# Patient Record
Sex: Female | Born: 1995 | Race: Black or African American | Hispanic: No | Marital: Single | Smoking: Never smoker
Health system: Southern US, Community
[De-identification: ages and names within clinical notes are randomized; demographics above are authoritative.]

## PROBLEM LIST (undated history)

## (undated) DIAGNOSIS — F410 Panic disorder [episodic paroxysmal anxiety] without agoraphobia: Secondary | ICD-10-CM

## (undated) DIAGNOSIS — F419 Anxiety disorder, unspecified: Secondary | ICD-10-CM

## (undated) DIAGNOSIS — K219 Gastro-esophageal reflux disease without esophagitis: Secondary | ICD-10-CM

## (undated) DIAGNOSIS — K5909 Other constipation: Secondary | ICD-10-CM

## (undated) HISTORY — DX: Morbid (severe) obesity due to excess calories: E66.01

## (undated) HISTORY — PX: NO PAST SURGERIES: SHX2092

## (undated) HISTORY — DX: Other constipation: K59.09

---
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
## 2016-07-18 ENCOUNTER — Ambulatory Visit (HOSPITAL_COMMUNITY)
Admission: EM | Admit: 2016-07-18 | Discharge: 2016-07-18 | Disposition: A | Discharge status: Home / Self Care | Payer: Medicaid Other | Attending: Internal Medicine | Admitting: Internal Medicine

## 2016-07-18 ENCOUNTER — Encounter (HOSPITAL_COMMUNITY): Payer: Self-pay | Admitting: Family Medicine

## 2016-07-18 DIAGNOSIS — R071 Chest pain on breathing: Secondary | ICD-10-CM

## 2016-07-18 DIAGNOSIS — K219 Gastro-esophageal reflux disease without esophagitis: Secondary | ICD-10-CM

## 2016-07-18 DIAGNOSIS — R079 Chest pain, unspecified: Secondary | ICD-10-CM | POA: Diagnosis not present

## 2016-07-18 DIAGNOSIS — R1013 Epigastric pain: Secondary | ICD-10-CM

## 2016-07-18 DIAGNOSIS — R0789 Other chest pain: Secondary | ICD-10-CM

## 2016-07-18 MED ORDER — OMEPRAZOLE 20 MG PO CPDR: 20.0000 mg | DELAYED_RELEASE_CAPSULE | Freq: Every day | ORAL | 0 refills | Status: DC

## 2016-07-18 NOTE — ED Provider Notes (Signed)
CSN: [id number]     Arrival date & time 07/18/16  1444 History   None    Chief Complaint  Patient presents with  . Chest Pain   (Consider location/radiation/quality/duration/timing/severity/associated sxs/prior Treatment) HPI  Michelle Simon is a 21 y.o. female presenting to UC with c/o intermittent chest pain and upper abdominal/epigastric pain for 3-4 days.  Hx of costochondritis and acid reflux.  She has tried Advil, which provided temporary relief.  She has also taken Tums with mild relief. Pain started after pt ate the other day.  Pt also notes she has a hx of anxiety. No prior hx of heart problems.  Denies SOB but she does get a split second of sharp electric shock pain in the center of her chest at times.  Denies diaphoresis, n/v/d.  No hx of blood clots. Denies leg pain or swelling. No hx of asthma.    Past Medical History:  Diagnosis Date  . Anxiety   . GERD (gastroesophageal reflux disease)    History reviewed. No pertinent surgical history. History reviewed. No pertinent family history. Social History  Substance Use Topics  . Smoking status: Never Smoker  . Smokeless tobacco: Never Used  . Alcohol use No   OB History    No data available     Review of Systems  Constitutional: Negative for chills, diaphoresis and fever.  HENT: Positive for congestion. Negative for ear pain, sore throat, trouble swallowing and voice change.   Respiratory: Positive for cough. Negative for shortness of breath.   Cardiovascular: Positive for chest pain. Negative for palpitations.  Gastrointestinal: Positive for abdominal pain (epigastric). Negative for diarrhea, nausea and vomiting.  Musculoskeletal: Negative for arthralgias, back pain and myalgias.  Skin: Negative for rash.    Allergies  Augmentin [amoxicillin-pot clavulanate]  Home Medications   Prior to Admission medications   Medication Sig Start Date End Date Taking? Authorizing Provider  IBUPROFEN PO Take by mouth.     Historical Provider, MD  LORazepam (ATIVAN) 1 MG tablet Take 1 mg by mouth every 8 (eight) hours as needed for anxiety.    Historical Provider, MD  norelgestromin-ethinyl estradiol (ORTHO EVRA) 150-35 MCG/24HR transdermal patch Place 1 patch onto the skin once a week.    Historical Provider, MD  omeprazole (PRILOSEC) 20 MG capsule Take 1 capsule (20 mg total) by mouth daily. 07/18/16   Junius Finner, PA-C   Meds Ordered and Administered this Visit  Medications - No data to display  BP 127/82   Pulse (!) 101   Temp 99.1 F (37.3 C)   Resp 18   SpO2 99%  No data found.   Physical Exam  Constitutional: She is oriented to person, place, and time. She appears well-developed and well-nourished. No distress.  HENT:  Head: Normocephalic and atraumatic.  Mouth/Throat: Oropharynx is clear and moist.  Eyes: EOM are normal.  Neck: Normal range of motion. Neck supple.  Cardiovascular: Normal rate and regular rhythm.   Pulmonary/Chest: Effort normal and breath sounds normal. No stridor. No respiratory distress. She has no wheezes. She has no rales.  Abdominal: Soft. She exhibits no distension and no mass. There is tenderness ( epigastrium). There is no rebound and no guarding.  Musculoskeletal: Normal range of motion.  Lymphadenopathy:    She has no cervical adenopathy.  Neurological: She is alert and oriented to person, place, and time.  Skin: Skin is warm and dry. She is not diaphoretic.  Psychiatric: She has a normal mood and affect. Her behavior is  normal.  Nursing note and vitals reviewed.   Urgent Care Course     Procedures (including critical care time)  Labs Review Labs Reviewed - No data to display  Imaging Review No results found.    MDM   1. Chest pain in adult   2. Costochondral chest pain   3. Epigastric pain   4. Gastroesophageal reflux disease, esophagitis presence not specified    Pt c/o epigastric pain and chest pain that is intermittent for 3-4 days.     Exam- mild epigastric tenderness, otherwise unremarkable. O2 Sat 99% on RA  Doubt PE or ACS  Will try trial of Omeprazole. It looks like pt has been on it in the past.  Encouraged f/u with PCP in 2 weeks if not improving. Discussed symptoms that warrant emergent care in the ED.    Junius Finner, PA-C 07/18/16 [phone]

## 2016-07-18 NOTE — ED Triage Notes (Addendum)
Pt here for chest pain. sts that she has a hx of reflux and costochondritis. sts she took advil Sunday and Monday and it helped. sts that sharp electric shock pain that is intermittent. Denies SOB or any other symptoms. sts the other day she felt like she was getting a cold and some coughing. sts hurts to breathe at times.

## 2016-11-25 ENCOUNTER — Emergency Department (HOSPITAL_COMMUNITY)
Admission: EM | Admit: 2016-11-25 | Discharge: 2016-11-25 | Disposition: A | Discharge status: Home / Self Care | Payer: Medicaid Other | Attending: Emergency Medicine | Admitting: Emergency Medicine

## 2016-11-25 ENCOUNTER — Emergency Department (HOSPITAL_COMMUNITY): Payer: Medicaid Other

## 2016-11-25 ENCOUNTER — Encounter (HOSPITAL_COMMUNITY): Payer: Self-pay | Admitting: Emergency Medicine

## 2016-11-25 DIAGNOSIS — B351 Tinea unguium: Secondary | ICD-10-CM | POA: Insufficient documentation

## 2016-11-25 DIAGNOSIS — Z79899 Other long term (current) drug therapy: Secondary | ICD-10-CM | POA: Diagnosis not present

## 2016-11-25 DIAGNOSIS — F419 Anxiety disorder, unspecified: Secondary | ICD-10-CM | POA: Diagnosis present

## 2016-11-25 DIAGNOSIS — F410 Panic disorder [episodic paroxysmal anxiety] without agoraphobia: Secondary | ICD-10-CM | POA: Insufficient documentation

## 2016-11-25 MED ORDER — TERBINAFINE HCL 250 MG PO TABS: 250.0000 mg | ORAL_TABLET | Freq: Every day | ORAL | 1 refills | Status: AC

## 2016-11-25 MED ORDER — HYDROXYZINE HCL 25 MG PO TABS: 25.0000 mg | ORAL_TABLET | Freq: Three times a day (TID) | ORAL | 0 refills | Status: DC | PRN

## 2016-11-25 NOTE — ED Triage Notes (Signed)
Woke up anxious and short of breath.  Took ativan 1 mg at home.  Denies having any SOB at this time.  Reports feeling better.

## 2016-11-25 NOTE — ED Provider Notes (Signed)
[facility] Provider Note   CSN: [id number] Arrival date & time: 11/25/16  0446     History   Chief Complaint Chief Complaint  Patient presents with  . Anxiety    HPI Michelle Simon is a 21 y.o. female.  HPI   21 yo F with PMHx GERD, anxiety here with anxiety. Pt states she has h/o panic attacks and has been under increased stress recently. 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 She was up late and admits to heavy caffeine intake. She awoke this AM feeling anxious then SOB. She felt like she was breathing fast and couldn't catch her breath. She then became anxious and worried. Took one ativan which resolved her sx within 30 minutes. She has had no subsequent sx. No leg swelling. No CP. No h/o blood clots. She feels better now.  Of note, she also incidentally notes small areas of discoloration on b/l big toenails after recent pedicure. No pain or redness. No fever. No drainage.  Past Medical History:  Diagnosis Date  . Anxiety   . GERD (gastroesophageal reflux disease)     There are no active problems to display for this patient.   History reviewed. No pertinent surgical history.  OB History    No data available       Home Medications    Prior to Admission medications   Medication Sig Start Date End Date Taking? Authorizing Provider  hydrOXYzine (ATARAX/VISTARIL) 25 MG tablet Take 1 tablet (25 mg total) by mouth every 8 (eight) hours as needed for anxiety. 11/25/16   Shaune Pollack, MD  IBUPROFEN PO Take by mouth.    [provider]  LORazepam (ATIVAN) 1 MG tablet Take 1 mg by mouth every 8 (eight) hours as needed for anxiety.    [provider]  norelgestromin-ethinyl estradiol (ORTHO EVRA) 150-35 MCG/24HR transdermal patch Place 1 patch onto the skin once a week.    [provider]  omeprazole (PRILOSEC) 20 MG capsule Take 1 capsule (20 mg total) by mouth daily. 07/18/16   Lurene Shadow, PA-C  terbinafine  (LAMISIL) 250 MG tablet Take 1 tablet (250 mg total) by mouth daily. Take for 6 weeks then re-examine nail; if you have persistent discoloration, take for an additional 6 weeks 11/25/16 01/06/17  Shaune Pollack, MD    Family History No family history on file.  Social History Social History  Substance Use Topics  . Smoking status: Never Smoker  . Smokeless tobacco: Never Used  . Alcohol use No     Allergies   Augmentin [amoxicillin-pot clavulanate]   Review of Systems Review of Systems  Constitutional: Negative for chills, fatigue and fever.  HENT: Negative for congestion and rhinorrhea.   Eyes: Negative for visual disturbance.  Respiratory: Negative for cough, shortness of breath and wheezing.   Cardiovascular: Negative for chest pain and leg swelling.  Gastrointestinal: Negative for abdominal pain, diarrhea, nausea and vomiting.  Genitourinary: Negative for dysuria and flank pain.  Musculoskeletal: Negative for neck pain and neck stiffness.  Skin: Positive for rash (toenail color change bilaterally). Negative for wound.  Allergic/Immunologic: Negative for immunocompromised state.  Neurological: Negative for syncope, weakness and headaches.  Psychiatric/Behavioral: The patient is nervous/anxious.   All other systems reviewed and are negative.    Physical Exam Updated Vital Signs BP 120/80 (BP Location: Right Arm)   Pulse 92   Temp 99.5 F (37.5 C) (Oral)   Resp 16   Ht 5\' 4"  (1.626 m)  Wt 115.7 kg (255 lb)   SpO2 100%   BMI 43.77 kg/m   Physical Exam  Constitutional: She is oriented to person, place, and time. She appears well-developed and well-nourished. No distress.  HENT:  Head: Normocephalic and atraumatic.  Eyes: Conjunctivae are normal.  Neck: Neck supple.  Cardiovascular: Normal rate, regular rhythm and normal heart sounds.  Exam reveals no friction rub.   No murmur heard. Pulmonary/Chest: Effort normal and breath sounds normal. No respiratory  distress. She has no wheezes. She has no rales.  Abdominal: She exhibits no distension.  Musculoskeletal: She exhibits no edema.  Neurological: She is alert and oriented to person, place, and time. She exhibits normal muscle tone.  Skin: Skin is warm. Capillary refill takes less than 2 seconds.  Hyperpigmented, thickened big toenails bilaterally; no drainage or erythema  Psychiatric: She has a normal mood and affect.  Nursing note and vitals reviewed.    ED Treatments / Results  Labs (all labs ordered are listed, but only abnormal results are displayed) Labs Reviewed - No data to display  EKG  EKG Interpretation  Date/Time:  Sunday November 25 2016 05:02:26 EDT Ventricular Rate:  97 PR Interval:  166 QRS Duration: 90 QT Interval:  336 QTC Calculation: 426 R Axis:   91 Text Interpretation:  Sinus rhythm with marked sinus arrhythmia Rightward axis Nonspecific T wave abnormality Abnormal ECG When compared with ECG of 06/14/2015, No significant change was found Confirmed by Glick, David ([id number]) on 11/25/2016 5:26:01 AM       Radiology Dg Chest 2 View  Result Date: 11/25/2016 CLINICAL DATA:  Acute onset of generalized chest tightness and shortness of breath. Initial encounter. EXAM: CHEST  2 VIEW COMPARISON:  Chest radiograph performed 06/14/2015 FINDINGS: The lungs are well-aerated and clear. There is no evidence of focal opacification, pleural effusion or pneumothorax. The heart is normal in size; the mediastinal contour is within normal limits. No acute osseous abnormalities are seen. IMPRESSION: No acute cardiopulmonary process seen. Electronically Signed   By: Jeffery  Chang M.D.   On: 11/25/2016 05:51    Procedures Procedures (including critical care time)  Medications Ordered in ED Medications - No data to display   Initial Impression / Assessment and Plan / ED Course  I have reviewed the triage vital signs and the nursing notes.  Pertinent labs & imaging results that  were available during my care of the patient were reviewed by me and considered in my medical decision making (see chart for details).     21  yo F here with transient SOB, now resolved s/p ativan. Suspect acute anxiety with panic attack in setting of recent stressors, heavy caffeine use, lack of sleep. No red flags. EKG normal, no signs of arrhythmia or ACS. No SOB, leg swelling, she is satting >99% on RA and is in NAD now after ativan - do not suspect PE. No other acute medical changes. Will tx with hydroxyzine PRN home and encourage PCP/outpatient follow-up.  Regarding pt's toenail discoloration, suspect mild onychomycosis. Given that it only involves great toes, will tx topically. No signs of paronychia or superimposed infection.  Final Clinical Impressions(s) / ED Diagnoses   Final diagnoses:  Anxiety attack  Onychomycosis    New Prescriptions Discharge Medication List as of 11/25/2016  7:28 AM    START taking these medications   Details  hydrOXYzine (ATARAX/VISTARIL) 25 MG tablet Take 1 tablet (25 mg total) by mouth every 8 (eight) hours as needed for anxiety., Starting Sun 11/25/2016,  Print    terbinafine (LAMISIL) 250 MG tablet Take 1 tablet (250 mg total) by mouth daily. Take for 6 weeks then re-examine nail; if you have persistent discoloration, take for an additional 6 weeks, Starting Sun 11/25/2016, Until Sun 01/06/2017, Print         Shaune PollackIsaacs, Orpha Dain, MD 11/25/16 [phone]

## 2016-12-13 ENCOUNTER — Ambulatory Visit (HOSPITAL_COMMUNITY)
Admission: EM | Admit: 2016-12-13 | Discharge: 2016-12-13 | Disposition: A | Discharge status: Home / Self Care | Payer: Medicaid Other | Attending: Family Medicine | Admitting: Family Medicine

## 2016-12-13 ENCOUNTER — Encounter (HOSPITAL_COMMUNITY): Payer: Self-pay | Admitting: *Deleted

## 2016-12-13 DIAGNOSIS — R202 Paresthesia of skin: Secondary | ICD-10-CM

## 2016-12-13 NOTE — ED Triage Notes (Signed)
Pt  States  yest    She  Developed   Some  Numbness   And  Tingling  To  The  l  Arm    decribes    As  Pins  And  Needles     Denies   Any  Neck  Pain      Or  Any  Injury

## 2016-12-13 NOTE — ED Provider Notes (Signed)
  [facility]   [id number] 12/13/16 Arrival Time: 1250  ASSESSMENT & PLAN:  1. Tingling of left upper extremity    Reassured this does not sound like anything serious. Possibly related to inflammation in L shoulder given her description of occasional shoulder soreness. Normal exam. Ibuprofen with food for the next several days. Will return if not improving or if she feels symptoms are worsening.  Reviewed expectations re: course of current medical issues. Questions answered. Outlined signs and symptoms indicating need for more acute intervention. Patient verbalized understanding. After Visit Summary given.   SUBJECTIVE:  Michelle Simon is a 21 y.o. female who presents with complaint of tingling in her L arm, shoulder down. Gradual onset yesterday. Off and on. Occasional discomfort in L shoulder. No injury or trauma. No extremity weakness reported. No neck pain. No h/o similar. No self treatment.  ROS: As per HPI.   OBJECTIVE:  Vitals:   12/13/16 1410  BP: 130/80  Pulse: 72  Resp: 18  Temp: 99.7 F (37.6 C)  TempSrc: Oral  SpO2: 99%    General appearance: alert; no distress Eyes: PERRLA; EOMI; conjunctiva normal Neck: supple Lungs: clear to auscultation bilaterally Heart: regular rate and rhythm Extremities: no cyanosis or edema; symmetrical with no gross deformities Skin: warm and dry Neurologic: CN 2-12 grossly intact; normal UE strength and sensation; normal gait; normal symmetric reflexes of all extremities Psychological: alert and cooperative; normal mood and affect  Allergies  Allergen Reactions  . Augmentin [Amoxicillin-Pot Clavulanate] Nausea And Vomiting    Past Medical History:  Diagnosis Date  . Anxiety   . GERD (gastroesophageal reflux disease)    Social History   Social History  . Marital status: Single    Spouse name: N/A  . Number of children: N/A  . Years of education: N/A   Occupational History  . Not on file.   Social  History Main Topics  . Smoking status: Never Smoker  . Smokeless tobacco: Never Used  . Alcohol use No  . Drug use: No  . Sexual activity: Not on file   Other Topics Concern  . Not on file   Social History Narrative  . No narrative on file   History reviewed. No pertinent surgical history.   Mardella LaymanHagler, Romello Hoehn, MD 12/13/16 [phone]

## 2017-05-01 ENCOUNTER — Encounter (HOSPITAL_COMMUNITY): Payer: Self-pay | Admitting: Emergency Medicine

## 2017-05-01 ENCOUNTER — Ambulatory Visit (HOSPITAL_COMMUNITY)
Admission: EM | Admit: 2017-05-01 | Discharge: 2017-05-01 | Disposition: A | Discharge status: Home / Self Care | Payer: Medicaid Other | Attending: Urgent Care | Admitting: Urgent Care

## 2017-05-01 DIAGNOSIS — R300 Dysuria: Secondary | ICD-10-CM

## 2017-05-01 DIAGNOSIS — F419 Anxiety disorder, unspecified: Secondary | ICD-10-CM | POA: Insufficient documentation

## 2017-05-01 DIAGNOSIS — K219 Gastro-esophageal reflux disease without esophagitis: Secondary | ICD-10-CM | POA: Insufficient documentation

## 2017-05-01 DIAGNOSIS — Z202 Contact with and (suspected) exposure to infections with a predominantly sexual mode of transmission: Secondary | ICD-10-CM

## 2017-05-01 DIAGNOSIS — N760 Acute vaginitis: Secondary | ICD-10-CM

## 2017-05-01 DIAGNOSIS — Z7251 High risk heterosexual behavior: Secondary | ICD-10-CM

## 2017-05-01 DIAGNOSIS — Z3202 Encounter for pregnancy test, result negative: Secondary | ICD-10-CM | POA: Diagnosis not present

## 2017-05-01 DIAGNOSIS — R102 Pelvic and perineal pain: Secondary | ICD-10-CM | POA: Diagnosis present

## 2017-05-01 DIAGNOSIS — Z881 Allergy status to other antibiotic agents status: Secondary | ICD-10-CM | POA: Diagnosis not present

## 2017-05-01 DIAGNOSIS — N898 Other specified noninflammatory disorders of vagina: Secondary | ICD-10-CM

## 2017-05-01 LAB — POCT URINALYSIS DIP (DEVICE)
BILIRUBIN URINE: NEGATIVE
Glucose, UA: NEGATIVE mg/dL
Hgb urine dipstick: NEGATIVE
KETONES UR: NEGATIVE mg/dL
NITRITE: NEGATIVE
Protein, ur: NEGATIVE mg/dL
Specific Gravity, Urine: 1.025 (ref 1.005–1.030)
Urobilinogen, UA: 1 mg/dL (ref 0.0–1.0)
pH: 6.5 (ref 5.0–8.0)

## 2017-05-01 LAB — POCT PREGNANCY, URINE: PREG TEST UR: NEGATIVE

## 2017-05-01 MED ORDER — AZITHROMYCIN 250 MG PO TABS
ORAL_TABLET | ORAL | Status: AC
  Filled 2017-05-01: qty 4

## 2017-05-01 MED ORDER — CEFTRIAXONE SODIUM 250 MG IJ SOLR
250.0000 mg | Freq: Once | INTRAMUSCULAR | Status: AC
  Administered 2017-05-01: 250 mg via INTRAMUSCULAR

## 2017-05-01 MED ORDER — LIDOCAINE HCL (PF) 1 % IJ SOLN
INTRAMUSCULAR | Status: AC
  Filled 2017-05-01: qty 2

## 2017-05-01 MED ORDER — CEFTRIAXONE SODIUM 250 MG IJ SOLR
INTRAMUSCULAR | Status: AC
  Filled 2017-05-01: qty 250

## 2017-05-01 MED ORDER — AZITHROMYCIN 250 MG PO TABS
1000.0000 mg | ORAL_TABLET | Freq: Once | ORAL | Status: AC
  Administered 2017-05-01: 1000 mg via ORAL

## 2017-05-01 NOTE — ED Triage Notes (Signed)
PT C/O: vag swelling and mild discomfort onset yest  Sts she just recently became sexually active female partner after a year of not having SI.... Believes sx may have arised from this.   No condom was used   Also reports vag waxing last week.   DENIES: urinary sx, abd pain  TAKING MEDS: Ibuprofen w/some relief.   A&O x4... NAD... Ambulatory

## 2017-05-01 NOTE — Discharge Instructions (Addendum)
Please abstain from sex for at least one week following treatment.

## 2017-05-01 NOTE — ED Provider Notes (Addendum)
  MRN: [id number] DOB: 05/21/1995  Subjective:   Michelle Simon is a [age] y.o. female presenting for 1 day history of vaginal pain, swelling, redness. Had one episode of dysuria this morning, urinary urgency. Has tried ibuprofen with some relief. Patient just started having sex with a new partner, did not use a condom. Denies fever, n/v, abdominal pain, pelvic pain, vaginal discharge, hematuria, urinary frequency. Denies smoking cigarettes.  Michelle Simon is allergic to augmentin [amoxicillin-pot clavulanate].  Michelle Simon  has a past medical history of Anxiety and GERD (gastroesophageal reflux disease). Denies past surgical history.  Objective:   Vitals: BP 130/81 (BP Location: Right Arm)   Pulse 87   Temp 98.6 F (37 C) (Oral)   Resp 20   LMP 03/27/2017   SpO2 99%   Physical Exam  Constitutional: She is oriented to person, place, and time. She appears well-developed and well-nourished.  Cardiovascular: Normal rate, regular rhythm and intact distal pulses. Exam reveals no gallop and no friction rub.  No murmur heard. Pulmonary/Chest: No respiratory distress. She has no wheezes. She has no rales.  Abdominal: Soft. Bowel sounds are normal. She exhibits no distension and no mass. There is no tenderness. There is no guarding.  Genitourinary: No labial fusion. There is no rash, tenderness, lesion or injury on the right labia. There is no rash, tenderness, lesion or injury on the left labia. Cervix exhibits discharge (copious yellow). Cervix exhibits no motion tenderness and no friability. No erythema, tenderness or bleeding in the vagina. No foreign body in the vagina. No signs of injury around the vagina. Vaginal discharge (white clumpy) found.  Neurological: She is alert and oriented to person, place, and time.  Skin: Skin is warm and dry.    Results for orders placed or performed during the hospital encounter of 05/01/17 (from the past 24 hour(s))  POCT urinalysis dip (device)     Status: Abnormal   Collection Time: 05/01/17 10:42 AM  Result Value Ref Range   Glucose, UA NEGATIVE NEGATIVE mg/dL   Bilirubin Urine NEGATIVE NEGATIVE   Ketones, ur NEGATIVE NEGATIVE mg/dL   Specific Gravity, Urine 1.025 1.005 - 1.030   Hgb urine dipstick NEGATIVE NEGATIVE   pH 6.5 5.0 - 8.0   Protein, ur NEGATIVE NEGATIVE mg/dL   Urobilinogen, UA 1.0 0.0 - 1.0 mg/dL   Nitrite NEGATIVE NEGATIVE   Leukocytes, UA TRACE (A) NEGATIVE  Pregnancy, urine POC     Status: None   Collection Time: 05/01/17 10:48 AM  Result Value Ref Range   Preg Test, Ur NEGATIVE NEGATIVE   Assessment and Plan :   Vaginitis and vulvovaginitis  Unprotected sex  Will treat empirically for gonorrhea and chlamydia, labs pending.   Wallis BambergMani, Heidy Mccubbin, PA-C 05/01/17 1115    Wallis BambergMani, Deryl Giroux, PA-C 05/01/17 1115

## 2017-05-02 LAB — CERVICOVAGINAL ANCILLARY ONLY
Bacterial vaginitis: NEGATIVE
Candida vaginitis: POSITIVE — AB
Chlamydia: NEGATIVE
Neisseria Gonorrhea: NEGATIVE
Trichomonas: NEGATIVE

## 2017-05-02 LAB — URINE CULTURE

## 2017-05-02 LAB — URINE CYTOLOGY ANCILLARY ONLY
Chlamydia: NEGATIVE
NEISSERIA GONORRHEA: NEGATIVE
TRICH (WINDOWPATH): NEGATIVE

## 2017-05-03 ENCOUNTER — Telehealth (HOSPITAL_COMMUNITY): Payer: Self-pay | Admitting: Internal Medicine

## 2017-05-03 MED ORDER — FLUCONAZOLE 150 MG PO TABS: 150.0000 mg | ORAL_TABLET | Freq: Once | ORAL | 0 refills | Status: AC

## 2017-05-03 NOTE — Telephone Encounter (Signed)
Clinical staff, please let patient know that test for candida (yeast) was positive.  Will send rx fluconazole to the pharmacy of record, [facility] in [place] on [address]  Recheck or followup with PCP for further evaluation if symptoms are not improving.  LM

## 2017-05-06 LAB — URINE CYTOLOGY ANCILLARY ONLY
Bacterial vaginitis: NEGATIVE
CANDIDA VAGINITIS: NEGATIVE

## 2017-07-15 ENCOUNTER — Other Ambulatory Visit: Payer: Self-pay

## 2017-07-15 ENCOUNTER — Emergency Department (HOSPITAL_COMMUNITY)
Admission: EM | Admit: 2017-07-15 | Discharge: 2017-07-15 | Disposition: A | Discharge status: Home / Self Care | Payer: Medicaid Other | Attending: Emergency Medicine | Admitting: Emergency Medicine

## 2017-07-15 ENCOUNTER — Encounter (HOSPITAL_COMMUNITY): Payer: Self-pay | Admitting: *Deleted

## 2017-07-15 DIAGNOSIS — R251 Tremor, unspecified: Secondary | ICD-10-CM | POA: Insufficient documentation

## 2017-07-15 DIAGNOSIS — R0789 Other chest pain: Secondary | ICD-10-CM | POA: Diagnosis not present

## 2017-07-15 DIAGNOSIS — R202 Paresthesia of skin: Secondary | ICD-10-CM | POA: Diagnosis not present

## 2017-07-15 DIAGNOSIS — Z79899 Other long term (current) drug therapy: Secondary | ICD-10-CM | POA: Diagnosis not present

## 2017-07-15 HISTORY — DX: Panic disorder (episodic paroxysmal anxiety): F41.0

## 2017-07-15 NOTE — ED Notes (Signed)
Updated on wait for treatment room. 

## 2017-07-15 NOTE — ED Triage Notes (Signed)
The pt is c/o ear pressure  Shaking all over  And her chest clenches for the past 45 minutes after smoking pot.  Hx of panic attacks she took an antrax 30 minutes ago to help

## 2017-07-15 NOTE — ED Provider Notes (Signed)
[facility] EMERGENCY DEPARTMENT Provider Note [facility]  CSN: [id number] Arrival date & time: 07/15/17  0112     History   Chief Complaint Chief Complaint  Patient presents with  . Shaking    HPI Michelle Simon is a 22 y.o. female with history of anxiety, panic attacks, GERD, costochondritis, here for evaluation of tingling all over her body, intermittent chest "clenching", shortness of breath, generalized tremors, and feeling like "things were speeding up" onset yesterday evening. States she felt anxious all day and symptoms were sudden, non exertional. Took her adderrax but felt like it was not working so she smoked marijuana. She then became more anxious and thought it was the marijuana and adderaxx combination, which made her symptoms worse. States she is a "hypochondriac" due to past family history, and decided to come to ED for evaluation. Symptoms have resolved.   Intermittent, occasional marijuana use. No ETOH or tobacco abuse. Chest tightness was non exertional, non pleuritic without dizziness, palpitations, frank exertional SOB, radiation. No h/o DVT/PE. Has missed counseling for several weeks.   HPI  Past Medical History:  Diagnosis Date  . Anxiety   . GERD (gastroesophageal reflux disease)   . Panic attack     There are no active problems to display for this patient.   History reviewed. No pertinent surgical history.   OB History   None      Home Medications    Prior to Admission medications   Medication Sig Start Date End Date Taking? Authorizing Provider  etonogestrel (NEXPLANON) 68 MG IMPL implant 1 each by Subdermal route once.    [provider]  hydrOXYzine (ATARAX/VISTARIL) 25 MG tablet Take 1 tablet (25 mg total) by mouth every 8 (eight) hours as needed for anxiety. 11/25/16   Shaune PollackIsaacs, Cameron, MD  IBUPROFEN PO Take by mouth.    [provider]  LORazepam (ATIVAN) 1 MG tablet Take 1 mg by mouth every 8 (eight) hours as  needed for anxiety.    [provider]  norelgestromin-ethinyl estradiol (ORTHO EVRA) 150-35 MCG/24HR transdermal patch Place 1 patch onto the skin once a week.    [provider]  omeprazole (PRILOSEC) 20 MG capsule Take 1 capsule (20 mg total) by mouth daily. 07/18/16   Lurene ShadowPhelps, Erin O, PA-C    Family History No family history on file.  Social History Social History   Tobacco Use  . Smoking status: Never Smoker  . Smokeless tobacco: Never Used  Substance Use Topics  . Alcohol use: No  . Drug use: No     Allergies   Augmentin [amoxicillin-pot clavulanate]   Review of Systems Review of Systems  Respiratory: Positive for shortness of breath.   Cardiovascular: Positive for chest pain.  Neurological:       Generalized tingling   Psychiatric/Behavioral: The patient is nervous/anxious.   All other systems reviewed and are negative.    Physical Exam Updated Vital Signs BP 124/80   Pulse 87   Temp 98.3 F (36.8 C) (Oral)   Resp 18   Ht 5\' 4"  (1.626 m)   Wt 115.7 kg (255 lb)   SpO2 100%   BMI 43.77 kg/m   Physical Exam  Constitutional: She appears well-developed and well-nourished.  NAD. Non toxic.   HENT:  Head: Normocephalic and atraumatic.  Nose: Nose normal.  Moist mucous membranes. Tonsils and oropharynx normal  Eyes: Conjunctivae, EOM and lids are normal.  Neck: Trachea normal and normal range of motion.  Neck is  supple Trachea midline No cervical adenopathy  Cardiovascular: Normal rate, regular rhythm, S1 normal, S2 normal and normal heart sounds.  Pulses:      Carotid pulses are 2+ on the right side, and 2+ on the left side.      Radial pulses are 2+ on the right side, and 2+ on the left side.       Dorsalis pedis pulses are 2+ on the right side, and 2+ on the left side.  RRR. No LE edema or calf tenderness.   Pulmonary/Chest: Effort normal and breath sounds normal. No respiratory distress. She has no decreased breath sounds. She has no  rhonchi.  No reproducible chest wall tenderness. CP not reproducible with AROM of upper extremities. No rales or wheezing.  Abdominal: Soft. Bowel sounds are normal. There is no tenderness.  No epigastric tenderness. No distention.   Neurological: She is alert. GCS eye subscore is 4. GCS verbal subscore is 5. GCS motor subscore is 6.  Skin: Skin is warm and dry. Capillary refill takes less than 2 seconds.  No rash to chest wall  Psychiatric: She has a normal mood and affect. Her speech is normal and behavior is normal. Judgment and thought content normal. Cognition and memory are normal.     ED Treatments / Results  Labs (all labs ordered are listed, but only abnormal results are displayed) Labs Reviewed - No data to display  EKG EKG Interpretation  Date/Time:  Monday July 15 2017 01:22:13 EDT Ventricular Rate:  118 PR Interval:  166 QRS Duration: 86 QT Interval:  308 QTC Calculation: 431 R Axis:   96 Text Interpretation:  Sinus tachycardia Confirmed by Nicanor Alcon, April ([id number]) on 07/15/2017 6:02:49 AM   Radiology No results found.  Procedures Procedures (including critical care time)  Medications Ordered in ED Medications - No data to display   Initial Impression / Assessment and Plan / ED Course  I have reviewed the triage vital signs and the nursing notes.  Pertinent labs & imaging results that were available during my care of the patient were reviewed by me and considered in my medical decision making (see chart for details).    Symptoms most likely secondary to anxiety. Symptoms have been intermittent but now completely resolved.  No pertinent risk factors including HTN, hypercholesterolemia, DM, smoking, positive family hx, known CAD.  She was initially tachycardic but this has resolved. On exam VS are wnl. Cardiovascular and pulmonary exam benign. HEART score is low. She has no pleuritic CP or frank SOB, and suspicion for PE is low in this patient.  Given reassuring  history, EKG, patient will be discharged with recommendation to follow up with PCP in regards to today's hospital visit. ED return preacutions given. Pt appears reliable for follow up and is agreeable to discharge.   Final Clinical Impressions(s) / ED Diagnoses   Final diagnoses:  Tingling  Chest tightness  Shaking    ED Discharge Orders    None       Jerrell Mylar 07/15/17 4034    Palumbo, April, MD 07/15/17 [phone]

## 2017-07-26 ENCOUNTER — Ambulatory Visit (HOSPITAL_COMMUNITY)
Admission: EM | Admit: 2017-07-26 | Discharge: 2017-07-26 | Disposition: A | Discharge status: Home / Self Care | Payer: Medicaid Other | Attending: Family Medicine | Admitting: Family Medicine

## 2017-07-26 ENCOUNTER — Telehealth (HOSPITAL_COMMUNITY): Payer: Self-pay | Admitting: Emergency Medicine

## 2017-07-26 ENCOUNTER — Encounter (HOSPITAL_COMMUNITY): Payer: Self-pay | Admitting: Emergency Medicine

## 2017-07-26 DIAGNOSIS — R51 Headache: Secondary | ICD-10-CM | POA: Diagnosis not present

## 2017-07-26 DIAGNOSIS — J029 Acute pharyngitis, unspecified: Secondary | ICD-10-CM

## 2017-07-26 DIAGNOSIS — K219 Gastro-esophageal reflux disease without esophagitis: Secondary | ICD-10-CM | POA: Diagnosis not present

## 2017-07-26 DIAGNOSIS — Z880 Allergy status to penicillin: Secondary | ICD-10-CM | POA: Insufficient documentation

## 2017-07-26 DIAGNOSIS — R509 Fever, unspecified: Secondary | ICD-10-CM | POA: Insufficient documentation

## 2017-07-26 DIAGNOSIS — F419 Anxiety disorder, unspecified: Secondary | ICD-10-CM | POA: Insufficient documentation

## 2017-07-26 DIAGNOSIS — Z79899 Other long term (current) drug therapy: Secondary | ICD-10-CM | POA: Diagnosis not present

## 2017-07-26 LAB — POCT RAPID STREP A: Streptococcus, Group A Screen (Direct): NEGATIVE

## 2017-07-26 MED ORDER — ACETAMINOPHEN 325 MG PO TABS
650.0000 mg | ORAL_TABLET | Freq: Once | ORAL | Status: AC
  Administered 2017-07-26: 650 mg via ORAL

## 2017-07-26 MED ORDER — AMOXICILLIN 500 MG PO CAPS: 500.0000 mg | ORAL_CAPSULE | Freq: Three times a day (TID) | ORAL | 0 refills | Status: DC

## 2017-07-26 MED ORDER — ACETAMINOPHEN 325 MG PO TABS
ORAL_TABLET | ORAL | Status: AC
  Filled 2017-07-26: qty 2

## 2017-07-26 NOTE — ED Provider Notes (Signed)
[facility]    CSN: [id number] Arrival date & time: 07/26/17  1335     History   Chief Complaint Chief Complaint  Patient presents with  . Fever    HPI Michelle Simon is a 22 y.o. female.   22 yo female here for sore throat x 1 day with associated fever and chills and headache. Patient has not tried anything for it. No known sick contacts. Nothing makes better or worse.      Past Medical History:  Diagnosis Date  . Anxiety   . GERD (gastroesophageal reflux disease)   . Panic attack     There are no active problems to display for this patient.   History reviewed. No pertinent surgical history.  OB History   None      Home Medications    Prior to Admission medications   Medication Sig Start Date End Date Taking? Authorizing Provider  etonogestrel (NEXPLANON) 68 MG IMPL implant 1 each by Subdermal route once.    [provider]  hydrOXYzine (ATARAX/VISTARIL) 25 MG tablet Take 1 tablet (25 mg total) by mouth every 8 (eight) hours as needed for anxiety. 11/25/16   Shaune PollackIsaacs, Cameron, MD  IBUPROFEN PO Take by mouth.    [provider]  LORazepam (ATIVAN) 1 MG tablet Take 1 mg by mouth every 8 (eight) hours as needed for anxiety.    [provider]  norelgestromin-ethinyl estradiol (ORTHO EVRA) 150-35 MCG/24HR transdermal patch Place 1 patch onto the skin once a week.    [provider]  omeprazole (PRILOSEC) 20 MG capsule Take 1 capsule (20 mg total) by mouth daily. 07/18/16   Lurene ShadowPhelps, Erin O, PA-C    Family History No family history on file.  Social History Social History   Tobacco Use  . Smoking status: Never Smoker  . Smokeless tobacco: Never Used  Substance Use Topics  . Alcohol use: No  . Drug use: No     Allergies   Augmentin [amoxicillin-pot clavulanate]   Review of Systems Review of Systems  Constitutional: Positive for chills and fever. Negative for activity change.  HENT: Positive for sore throat.  Negative for ear pain.   Eyes: Negative for discharge and itching.  Respiratory: Negative for apnea and chest tightness.   Cardiovascular: Negative for chest pain and leg swelling.  Gastrointestinal: Negative for abdominal distention and abdominal pain.  Endocrine: Negative for cold intolerance and heat intolerance.  Genitourinary: Negative for difficulty urinating and dysuria.  Musculoskeletal: Negative for arthralgias and back pain.  Neurological: Negative for dizziness and headaches.  Hematological: Negative for adenopathy. Does not bruise/bleed easily.     Physical Exam Triage Vital Signs ED Triage Vitals  Enc Vitals Group     BP 07/26/17 1428 129/87     Pulse Rate 07/26/17 1428 96     Resp 07/26/17 1428 18     Temp 07/26/17 1428 (!) 101.9 F (38.8 C)     Temp Source 07/26/17 1428 Oral     SpO2 07/26/17 1428 100 %     Weight --      Height --      Head Circumference --      Peak Flow --      Pain Score 07/26/17 1430 6     Pain Loc --      Pain Edu? --      Excl. in GC? --    No data found.  Updated Vital Signs BP 129/87 (BP Location: Left Arm)  Pulse 96   Temp (!) 101.9 F (38.8 C) (Oral)   Resp 18   SpO2 100%   Visual Acuity Right Eye Distance:   Left Eye Distance:   Bilateral Distance:    Right Eye Near:   Left Eye Near:    Bilateral Near:     Physical Exam  Constitutional: She is oriented to person, place, and time. She appears well-developed.  Ill appearing but not toxic  HENT:  Head: Normocephalic and atraumatic.  Tonsils +2 and erythematous with white exudate  Eyes: Pupils are equal, round, and reactive to light. EOM are normal.  Neck: Normal range of motion. Neck supple.  Pulmonary/Chest: Effort normal. No respiratory distress.  Musculoskeletal: Normal range of motion. She exhibits no edema.  Neurological: She is alert and oriented to person, place, and time.  Skin: Skin is warm and dry.  Psychiatric: She has a normal mood and affect. Her  behavior is normal.     UC Treatments / Results  Labs (all labs ordered are listed, but only abnormal results are displayed) Labs Reviewed  POCT RAPID STREP A    EKG None Radiology No results found.  Procedures Procedures (including critical care time)  Medications Ordered in UC Medications  acetaminophen (TYLENOL) tablet 650 mg (650 mg Oral Given 07/26/17 1433)     Initial Impression / Assessment and Plan / UC Course  I have reviewed the triage vital signs and the nursing notes.  Pertinent labs & imaging results that were available during my care of the patient were reviewed by me and considered in my medical decision making (see chart for details).     1. Pharyngitis- her throat looks like strep pharyngitis even though rapid strep was negative. Will treat with amoxicillin based on symptoms and exam. Follow up with [facility].  Final Clinical Impressions(s) / UC Diagnoses   Final diagnoses:  None    ED Discharge Orders    None       Controlled Substance Prescriptions [place] Controlled Substance Registry consulted? Not Applicable   Rolm Bookbinder, DO 07/26/17 1454

## 2017-07-26 NOTE — ED Triage Notes (Signed)
Pt c/o swollen tonsils, sore throat, fever chills since yesterday.

## 2017-07-28 LAB — CULTURE, GROUP A STREP (THRC)

## 2017-07-29 ENCOUNTER — Telehealth (HOSPITAL_COMMUNITY): Payer: Self-pay

## 2017-07-29 NOTE — Telephone Encounter (Signed)
Culture is positive for non group A Strep germ.  This is a finding of uncertain significance; not the typical 'strep throat' germ.  Pt reports feeling better and being on antibiotic already. Recheck for further evaluation if symptoms are not improving

## 2017-08-21 ENCOUNTER — Emergency Department (HOSPITAL_COMMUNITY)
Admission: EM | Admit: 2017-08-21 | Discharge: 2017-08-22 | Disposition: A | Discharge status: Home / Self Care | Payer: Medicaid Other | Attending: Emergency Medicine | Admitting: Emergency Medicine

## 2017-08-21 ENCOUNTER — Encounter (HOSPITAL_COMMUNITY): Payer: Self-pay | Admitting: Emergency Medicine

## 2017-08-21 DIAGNOSIS — Z79899 Other long term (current) drug therapy: Secondary | ICD-10-CM | POA: Insufficient documentation

## 2017-08-21 DIAGNOSIS — R030 Elevated blood-pressure reading, without diagnosis of hypertension: Secondary | ICD-10-CM | POA: Insufficient documentation

## 2017-08-21 LAB — CBC WITH DIFFERENTIAL/PLATELET
BASOS ABS: 0 10*3/uL (ref 0.0–0.1)
Basophils Relative: 0 %
EOS ABS: 0.1 10*3/uL (ref 0.0–0.7)
EOS PCT: 2 %
HCT: 39.1 % (ref 36.0–46.0)
Hemoglobin: 12.7 g/dL (ref 12.0–15.0)
LYMPHS ABS: 2.4 10*3/uL (ref 0.7–4.0)
Lymphocytes Relative: 48 %
MCH: 24.4 pg — ABNORMAL LOW (ref 26.0–34.0)
MCHC: 32.5 g/dL (ref 30.0–36.0)
MCV: 75 fL — AB (ref 78.0–100.0)
MONO ABS: 0.6 10*3/uL (ref 0.1–1.0)
Monocytes Relative: 13 %
Neutro Abs: 1.8 10*3/uL (ref 1.7–7.7)
Neutrophils Relative %: 37 %
PLATELETS: 252 10*3/uL (ref 150–400)
RBC: 5.21 MIL/uL — ABNORMAL HIGH (ref 3.87–5.11)
RDW: 14.1 % (ref 11.5–15.5)
WBC: 4.9 10*3/uL (ref 4.0–10.5)

## 2017-08-21 LAB — URINALYSIS, ROUTINE W REFLEX MICROSCOPIC
Bacteria, UA: NONE SEEN
Bilirubin Urine: NEGATIVE
GLUCOSE, UA: NEGATIVE mg/dL
HGB URINE DIPSTICK: NEGATIVE
KETONES UR: NEGATIVE mg/dL
Nitrite: NEGATIVE
PROTEIN: NEGATIVE mg/dL
Specific Gravity, Urine: 1.013 (ref 1.005–1.030)
pH: 6 (ref 5.0–8.0)

## 2017-08-21 LAB — BASIC METABOLIC PANEL
ANION GAP: 7 (ref 5–15)
BUN: <5 mg/dL — ABNORMAL LOW (ref 6–20)
CHLORIDE: 105 mmol/L (ref 101–111)
CO2: 25 mmol/L (ref 22–32)
Calcium: 8.9 mg/dL (ref 8.9–10.3)
Creatinine, Ser: 0.64 mg/dL (ref 0.44–1.00)
GFR calc Af Amer: >60 mL/min (ref >60)
Glucose, Bld: 108 mg/dL — ABNORMAL HIGH (ref 65–99)
POTASSIUM: 3.6 mmol/L (ref 3.5–5.1)
SODIUM: 137 mmol/L (ref 135–145)

## 2017-08-21 LAB — I-STAT BETA HCG BLOOD, ED (MC, WL, AP ONLY): I-stat hCG, quantitative: <5 m[IU]/mL (ref <5)

## 2017-08-21 NOTE — ED Triage Notes (Signed)
Patient reports elevated blood pressure this week with mild headache and insomnia , she is not taking antihypertensive medication .

## 2017-08-22 ENCOUNTER — Emergency Department (HOSPITAL_COMMUNITY)
Admission: EM | Admit: 2017-08-22 | Discharge: 2017-08-22 | Disposition: A | Discharge status: Home / Self Care | Payer: Self-pay | Attending: Emergency Medicine | Admitting: Emergency Medicine

## 2017-08-22 ENCOUNTER — Encounter (HOSPITAL_COMMUNITY): Payer: Self-pay | Admitting: Emergency Medicine

## 2017-08-22 ENCOUNTER — Other Ambulatory Visit: Payer: Self-pay

## 2017-08-22 DIAGNOSIS — Z79899 Other long term (current) drug therapy: Secondary | ICD-10-CM | POA: Insufficient documentation

## 2017-08-22 DIAGNOSIS — F419 Anxiety disorder, unspecified: Secondary | ICD-10-CM | POA: Insufficient documentation

## 2017-08-22 NOTE — ED Provider Notes (Signed)
[facility] EMERGENCY DEPARTMENT Provider Note   CSN: [id number] Arrival date & time: 08/22/17  1732     History   Chief Complaint No chief complaint on file.   HPI Michelle Simon is a 21 y.o. female.  Patient with history of anxiety and panic attacks. Not currently on medication or undergoing therapy. Family history of CVA, HTN, and patient constantly worries about her own health in light of family history. She was seen last night after discovering her blood pressure was elevated when checked at a local pharmacy.  She is not hypertensive today. She states she is not sleeping well. 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 She has a primary care provider at home ([place], [place]).  The history is provided by the patient. No language interpreter was used.  Anxiety  This is a recurrent problem. The current episode started more than 1 week ago. The problem occurs daily. The problem has not changed since onset.Associated symptoms include headaches. The symptoms are aggravated by stress.    Past Medical History:  Diagnosis Date  . Anxiety   . GERD (gastroesophageal reflux disease)   . Panic attack     There are no active problems to display for this patient.   History reviewed. No pertinent surgical history.   OB History   None      Home Medications    Prior to Admission medications   Medication Sig Start Date End Date Taking? Authorizing Provider  amoxicillin (AMOXIL) 500 MG capsule Take 1 capsule (500 mg total) by mouth 3 (three) times daily. 07/26/17   Moss, Amber, DO  etonogestrel (NEXPLANON) 68 MG IMPL implant 1 each by Subdermal route once.    [provider]  hydrOXYzine (ATARAX/VISTARIL) 25 MG tablet Take 1 tablet (25 mg total) by mouth every 8 (eight) hours as needed for anxiety. 11/25/16   Shaune Pollack, MD  IBUPROFEN PO Take by mouth.    [provider]  LORazepam (ATIVAN) 1 MG tablet Take 1 mg by mouth every 8  (eight) hours as needed for anxiety.    [provider]  norelgestromin-ethinyl estradiol (ORTHO EVRA) 150-35 MCG/24HR transdermal patch Place 1 patch onto the skin once a week.    [provider]  omeprazole (PRILOSEC) 20 MG capsule Take 1 capsule (20 mg total) by mouth daily. 07/18/16   Lurene Shadow, PA-C    Family History History reviewed. No pertinent family history.  Social History Social History   Tobacco Use  . Smoking status: Never Smoker  . Smokeless tobacco: Never Used  Substance Use Topics  . Alcohol use: No  . Drug use: No     Allergies   Augmentin [amoxicillin-pot clavulanate]   Review of Systems Review of Systems  Neurological: Positive for headaches.  Psychiatric/Behavioral: Positive for sleep disturbance. The patient is nervous/anxious.      Physical Exam Updated Vital Signs BP (!) 124/92 (BP Location: Right Arm)   Pulse 88   Temp 98.3 F (36.8 C) (Oral)   Resp 15   LMP 07/28/2017 (Approximate)   SpO2 100%   Physical Exam  Constitutional: She is oriented to person, place, and time. She appears well-developed and well-nourished. No distress.  HENT:  Mouth/Throat: Oropharynx is clear and moist.  Eyes: Conjunctivae are normal.  Cardiovascular: Normal rate and regular rhythm.  Pulmonary/Chest: Effort normal and breath sounds normal.  Abdominal: Soft.  Musculoskeletal: Normal range of motion. She exhibits no edema.  Neurological: She  is alert and oriented to person, place, and time.  Skin: Skin is warm and dry.  Psychiatric: Her speech is normal and behavior is normal. Judgment and thought content normal. Her mood appears anxious. Cognition and memory are normal.  Nursing note and vitals reviewed.    ED Treatments / Results  Labs (all labs ordered are listed, but only abnormal results are displayed) Labs Reviewed - No data to display  EKG None  Radiology No results found.  Procedures Procedures (including critical care  time)  Medications Ordered in ED Medications - No data to display   Initial Impression / Assessment and Plan / ED Course  I have reviewed the triage vital signs and the nursing notes.  Pertinent labs & imaging results that were available during my care of the patient were reviewed by me and considered in my medical decision making (see chart for details).     Patient presents to the emergency department complaining of symptoms consistent with anxiety.  Patient has a history of same with similar episodes.  The patient is resting comfortably, in no apparent distress and asymptomatic.  Vital signs reviewed.  No exophthalmos,  no signs of UTI.  Stress reducing mechanisms discussed including caffeine intake.  Patient has been referred to psychiatric services for follow-up.      Final Clinical Impressions(s) / ED Diagnoses   Final diagnoses:  Anxiety    ED Discharge Orders    None       Felicie Morn, NP 08/22/17 2028    Melene Plan, DO 08/22/17 2356

## 2017-08-22 NOTE — ED Provider Notes (Signed)
[facility] EMERGENCY DEPARTMENT Provider Note   CSN: [id number] Arrival date & time: 08/21/17  2059     History   Chief Complaint Chief Complaint  Patient presents with  . Hypertension    Headache    HPI Michelle Simon is a 22 y.o. female.  The history is provided by the patient.  Hypertension   She has been having some tingling in her head intermittently over the last several days and was worried that her blood pressure might be elevated.  She went to [facility] with the intention of buying a machine to check her blood pressure at home, but she checked her blood pressure in the store and it was elevated so she came straight to the hospital.  She has not had any true headache, just this intermittent tingling sensation.  She denies visual disturbance, nausea, vomiting.  She denies weakness, numbness, tingling.  There is a family history of hypertension and diabetes.  Past Medical History:  Diagnosis Date  . Anxiety   . GERD (gastroesophageal reflux disease)   . Panic attack     There are no active problems to display for this patient.   History reviewed. No pertinent surgical history.   OB History   None      Home Medications    Prior to Admission medications   Medication Sig Start Date End Date Taking? Authorizing Provider  amoxicillin (AMOXIL) 500 MG capsule Take 1 capsule (500 mg total) by mouth 3 (three) times daily. 07/26/17   Moss, Amber, DO  etonogestrel (NEXPLANON) 68 MG IMPL implant 1 each by Subdermal route once.    [provider]  hydrOXYzine (ATARAX/VISTARIL) 25 MG tablet Take 1 tablet (25 mg total) by mouth every 8 (eight) hours as needed for anxiety. 11/25/16   Shaune Pollack, MD  IBUPROFEN PO Take by mouth.    [provider]  LORazepam (ATIVAN) 1 MG tablet Take 1 mg by mouth every 8 (eight) hours as needed for anxiety.    [provider]  norelgestromin-ethinyl estradiol (ORTHO EVRA) 150-35 MCG/24HR  transdermal patch Place 1 patch onto the skin once a week.    [provider]  omeprazole (PRILOSEC) 20 MG capsule Take 1 capsule (20 mg total) by mouth daily. 07/18/16   Lurene Shadow, PA-C    Family History No family history on file.  Social History Social History   Tobacco Use  . Smoking status: Never Smoker  . Smokeless tobacco: Never Used  Substance Use Topics  . Alcohol use: No  . Drug use: No     Allergies   Augmentin [amoxicillin-pot clavulanate]   Review of Systems Review of Systems  All other systems reviewed and are negative.    Physical Exam Updated Vital Signs BP 131/86   Pulse 79   Temp 99 F (37.2 C) (Oral)   Resp 12   Ht  (1.626 m)   Wt 113.4 kg (250 lb)   LMP 07/28/2017 (Approximate)   SpO2 100%   BMI 42.91 kg/m   Physical Exam  Nursing note and vitals reviewed.  22 year old female, resting comfortably and in no acute distress. Vital signs are normal. Oxygen saturation is 100%, which is normal. Head is normocephalic and atraumatic. PERRLA, EOMI. Oropharynx is clear. Neck is nontender and supple without adenopathy or JVD. Back is nontender and there is no CVA tenderness. Lungs are clear without rales, wheezes, or rhonchi. Chest is nontender. Heart has regular rate and rhythm without murmur.  Abdomen is soft, flat, nontender without masses or hepatosplenomegaly and peristalsis is normoactive. Extremities have no cyanosis or edema, full range of motion is present. Skin is warm and dry without rash. Neurologic: Mental status is normal, cranial nerves are intact, there are no motor or sensory deficits.  ED Treatments / Results  Labs (all labs ordered are listed, but only abnormal results are displayed) Labs Reviewed  CBC WITH DIFFERENTIAL/PLATELET - Abnormal; Notable for the following components:      Result Value   RBC 5.21 (*)    MCV 75.0 (*)    MCH 24.4 (*)    All other components within normal limits  BASIC METABOLIC  PANEL - Abnormal; Notable for the following components:   Glucose, Bld 108 (*)    BUN <5 (*)    All other components within normal limits  URINALYSIS, ROUTINE W REFLEX MICROSCOPIC - Abnormal; Notable for the following components:   Leukocytes, UA TRACE (*)    All other components within normal limits  I-STAT BETA HCG BLOOD, ED ([facility], [facility], [facility] ONLY)   Procedures Procedures  Medications Ordered in ED Medications - No data to display   Initial Impression / Assessment and Plan / ED Course  I have reviewed the triage vital signs and the nursing notes.  Pertinent lab results that were available during my care of the patient were reviewed by me and considered in my medical decision making (see chart for details).  Nonspecific head discomfort.  Concern regarding blood pressure.  Old records are reviewed, and she has no relevant past visits.  While in the ED, blood pressure readings have been as high as 161 systolic, as low as 127 systolic.  Diastolic blood pressures of also varied.  At this point, I do not feel there is any connection between the tingling feeling she has had in her head and her blood pressure.  However, she has had several readings which were clearly in the hypertensive range.  I discussed this with her and recommended that she monitor her blood pressure at home for the next 2 weeks and follow-up with her PCP regarding blood pressure treatment at that time.  At this point, she asked me if there was any sign of blood clots.  She had no specific symptoms that sparked her concern about blood clots.  She was reassured that there are no signs of blood clots on her exam.  I have advised her that with strong family history of diabetes and hypertension, she would benefit from eating a good balanced diet, having regular exercise, and getting as close to ideal body weight as possible.  Patient expressed understanding.  Final Clinical Impressions(s) / ED Diagnoses   Final diagnoses:  Elevated  blood pressure reading without diagnosis of hypertension    ED Discharge Orders    None       Dione Booze, MD 08/22/17 [phone]

## 2017-08-22 NOTE — Discharge Instructions (Signed)
Buy a machine to check your blood pressure at home. Check your blood pressure once a day for the next two weeks, and keep a record of the readings. Talk with your doctor about the readings after two weeks.

## 2017-08-22 NOTE — ED Triage Notes (Signed)
Pt states she has been feeling anxious lately and stressed. Pt is feeling anxious about her cholesterol levels and BP. Concerned that her BP has been running high and wants her cholesterol checked. Pt states her immediate family has a lot of health problems.

## 2017-08-22 NOTE — ED Notes (Signed)
ED Provider at bedside. 

## 2018-12-11 ENCOUNTER — Other Ambulatory Visit: Payer: Self-pay | Admitting: *Deleted

## 2018-12-11 DIAGNOSIS — Z20822 Contact with and (suspected) exposure to covid-19: Secondary | ICD-10-CM

## 2018-12-12 LAB — NOVEL CORONAVIRUS, NAA: SARS-CoV-2, NAA: NOT DETECTED

## 2018-12-23 ENCOUNTER — Other Ambulatory Visit: Payer: Self-pay

## 2018-12-23 DIAGNOSIS — Z20822 Contact with and (suspected) exposure to covid-19: Secondary | ICD-10-CM

## 2018-12-25 LAB — NOVEL CORONAVIRUS, NAA: SARS-CoV-2, NAA: NOT DETECTED

## 2019-04-23 IMAGING — CR DG CHEST 2V
2 series · 2 of 2 positions shown · non-contrast
Comparison: Chest radiograph performed 06/14/2015

CLINICAL DATA: Acute onset of generalized chest tightness and
shortness of breath. Initial encounter.

EXAM:
CHEST  2 VIEW

[chest pa]
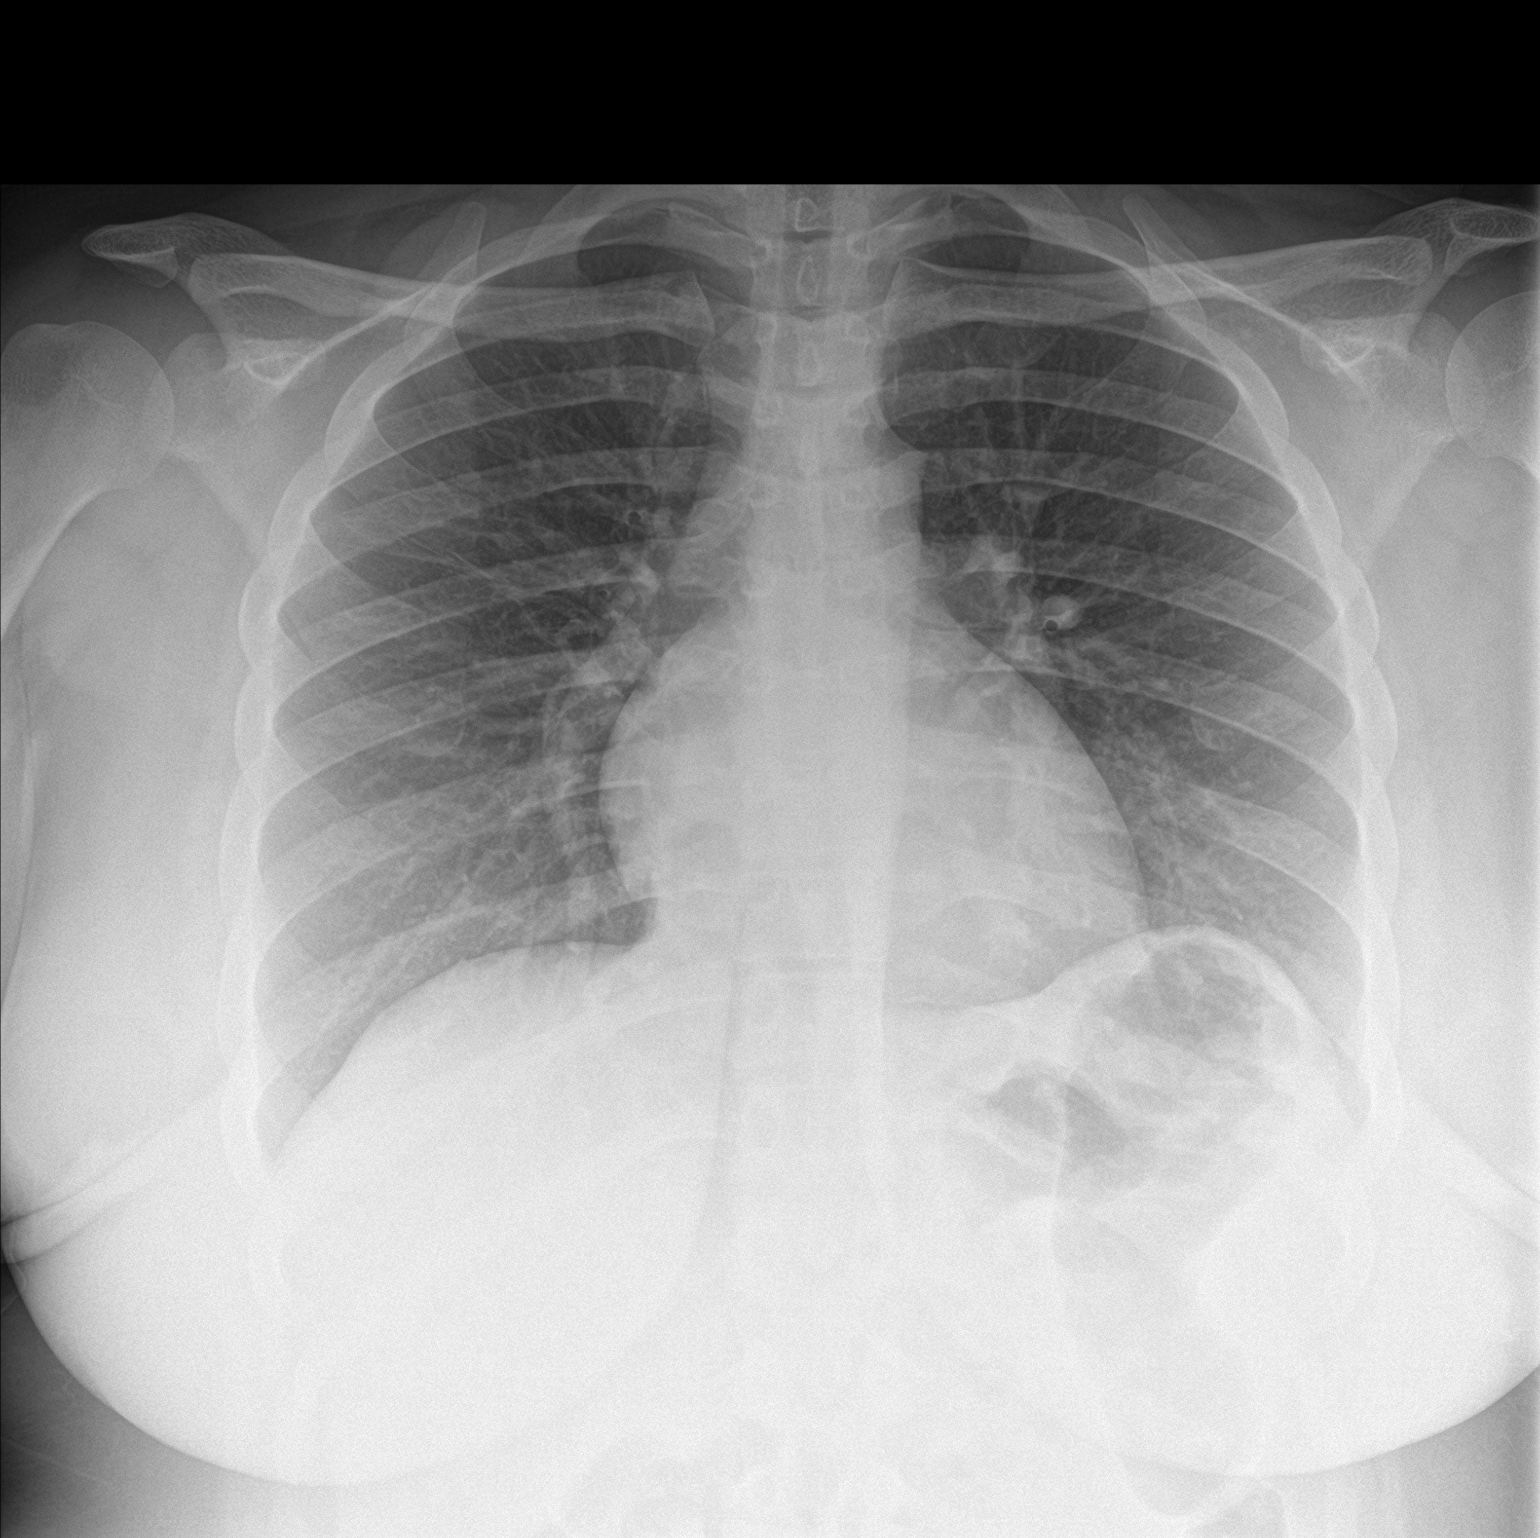

[chest lat]
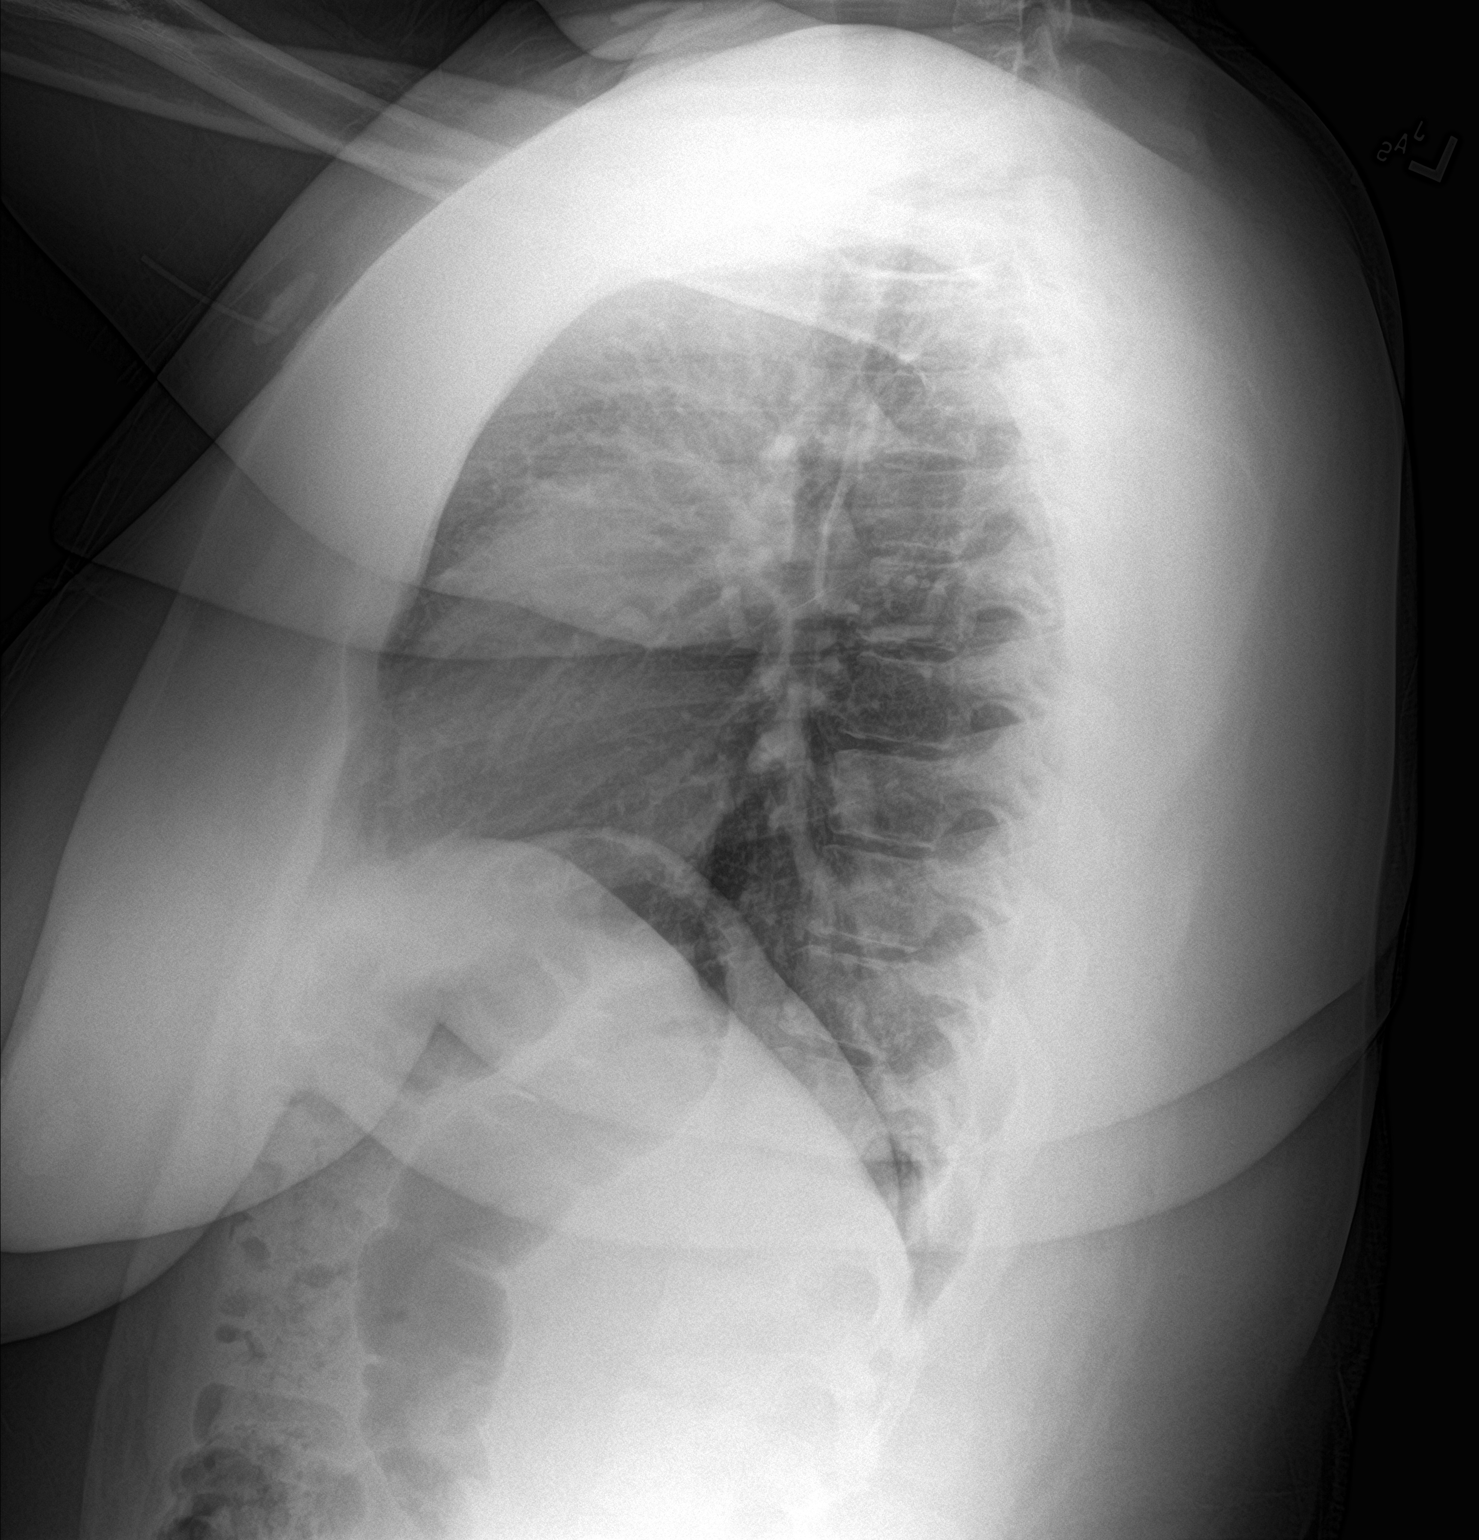

[2 of 2 positions shown; findings below may reference images not displayed]

FINDINGS: The lungs are well-aerated and clear. There is no evidence of focal
opacification, pleural effusion or pneumothorax.

The heart is normal in size; the mediastinal contour is within
normal limits. No acute osseous abnormalities are seen.
IMPRESSION: No acute cardiopulmonary process seen.

## 2019-06-25 ENCOUNTER — Ambulatory Visit: Payer: Self-pay | Attending: Internal Medicine

## 2019-06-25 DIAGNOSIS — Z23 Encounter for immunization: Secondary | ICD-10-CM

## 2019-06-25 NOTE — Progress Notes (Signed)
   Covid-19 Vaccination Clinic  Name:  Encarnacion Bole    MRN: [id number] DOB: July 03, 1995  06/25/2019  Ms. Cimino was observed post Covid-19 immunization for 15 minutes without incident. She was provided with Vaccine Information Sheet and instruction to access the V-Safe system.   Ms. Winberry was instructed to call 911 with any severe reactions post vaccine: Marland Kitchen Difficulty breathing  . Swelling of face and throat  . A fast heartbeat  . A bad rash all over body  . Dizziness and weakness   Immunizations Administered    Name Date Dose VIS Date Route   Moderna COVID-19 Vaccine 06/25/2019 10:25 AM 0.5 mL 03/17/2019 Intramuscular   Manufacturer: Moderna   Lot: [id number]   NDC: 15945-859-29

## 2019-06-25 NOTE — Progress Notes (Signed)
   Covid-19 Vaccination Clinic  Name:  Michelle Simon    MRN: [id number] DOB: 09/06/1995  06/25/2019  [name] was observed post Covid-19 immunization for 15 minutes without incident. She was provided with Vaccine Information Sheet and instruction to access the V-Safe system.   [name] was instructed to call 911 with any severe reactions post vaccine: . Difficulty breathing  . Swelling of face and throat  . A fast heartbeat  . A bad rash all over body  . Dizziness and weakness   Immunizations Administered    Name Date Dose VIS Date Route   Moderna COVID-19 Vaccine 06/25/2019 10:25 AM 0.5 mL 03/17/2019 Intramuscular   Manufacturer: Moderna   Lot: [id number]   NDC: 80777-273-99     

## 2019-07-28 ENCOUNTER — Ambulatory Visit: Payer: Self-pay | Attending: Family

## 2019-07-28 DIAGNOSIS — Z23 Encounter for immunization: Secondary | ICD-10-CM

## 2019-07-28 NOTE — Progress Notes (Signed)
   Covid-19 Vaccination Clinic  Name:  Michelle Simon    MRN: [id number] DOB: 1996-02-10  07/28/2019  [name] was observed post Covid-19 immunization for 15 minutes without incident. She was provided with Vaccine Information Sheet and instruction to access the V-Safe system.   [name] was instructed to call 911 with any severe reactions post vaccine: Marland Kitchen Difficulty breathing  . Swelling of face and throat  . A fast heartbeat  . A bad rash all over body  . Dizziness and weakness   Immunizations Administered    Name Date Dose VIS Date Route   Moderna COVID-19 Vaccine 07/28/2019 11:16 AM 0.5 mL 03/17/2019 Intramuscular   Manufacturer: Moderna   Lot: [id number]   NDC: 87215-872-76

## 2021-04-12 DIAGNOSIS — H52223 Regular astigmatism, bilateral: Secondary | ICD-10-CM | POA: Diagnosis not present

## 2021-04-23 DIAGNOSIS — Z20822 Contact with and (suspected) exposure to covid-19: Secondary | ICD-10-CM | POA: Diagnosis not present

## 2021-06-13 DIAGNOSIS — N939 Abnormal uterine and vaginal bleeding, unspecified: Secondary | ICD-10-CM | POA: Diagnosis not present

## 2021-06-13 DIAGNOSIS — N852 Hypertrophy of uterus: Secondary | ICD-10-CM | POA: Diagnosis not present

## 2021-06-13 DIAGNOSIS — N9412 Deep dyspareunia: Secondary | ICD-10-CM | POA: Diagnosis not present

## 2021-06-15 DIAGNOSIS — N939 Abnormal uterine and vaginal bleeding, unspecified: Secondary | ICD-10-CM | POA: Diagnosis not present

## 2021-07-06 DIAGNOSIS — N939 Abnormal uterine and vaginal bleeding, unspecified: Secondary | ICD-10-CM | POA: Diagnosis not present

## 2021-12-07 ENCOUNTER — Ambulatory Visit
Admission: EM | Admit: 2021-12-07 | Discharge: 2021-12-07 | Disposition: A | Discharge status: Home / Self Care | Payer: 59 | Attending: Urgent Care | Admitting: Urgent Care

## 2021-12-07 ENCOUNTER — Encounter: Payer: Self-pay | Admitting: Emergency Medicine

## 2021-12-07 DIAGNOSIS — R232 Flushing: Secondary | ICD-10-CM | POA: Insufficient documentation

## 2021-12-07 DIAGNOSIS — R531 Weakness: Secondary | ICD-10-CM | POA: Insufficient documentation

## 2021-12-07 DIAGNOSIS — R519 Headache, unspecified: Secondary | ICD-10-CM | POA: Insufficient documentation

## 2021-12-07 DIAGNOSIS — Z1152 Encounter for screening for COVID-19: Secondary | ICD-10-CM | POA: Diagnosis not present

## 2021-12-07 LAB — POCT URINALYSIS DIP (MANUAL ENTRY)
Bilirubin, UA: NEGATIVE
Blood, UA: NEGATIVE
Glucose, UA: NEGATIVE mg/dL
Ketones, POC UA: NEGATIVE mg/dL
Leukocytes, UA: NEGATIVE
Nitrite, UA: NEGATIVE
Protein Ur, POC: NEGATIVE mg/dL
Spec Grav, UA: 1.025 (ref 1.010–1.025)
Urobilinogen, UA: 0.2 E.U./dL
pH, UA: 6 (ref 5.0–8.0)

## 2021-12-07 LAB — POCT URINE PREGNANCY: Preg Test, Ur: NEGATIVE

## 2021-12-07 NOTE — ED Provider Notes (Signed)
[facility]   MRN: [id number] DOB: 1995-05-26  Subjective:   Michelle Simon is a 26 y.o. female presenting for 4-day history of acute onset persistent weakness, malaise and fatigue, hot and cold flashes, generalized headaches.  Patient started her cycle Saturday.  She has Nexplanon.  Has had intermittent chest pain.  Feels like her anxiety is causing her to worry about serious conditions including things like stroke.  She would like to be checked for COVID-19.  No history of clotting disorders.  No current facility-administered medications for this encounter.  Current Outpatient Medications:    amoxicillin (AMOXIL) 500 MG capsule, Take 1 capsule (500 mg total) by mouth 3 (three) times daily., Disp: 21 capsule, Rfl: 0   etonogestrel (NEXPLANON) 68 MG IMPL implant, 1 each by Subdermal route once., Disp: , Rfl:    hydrOXYzine (ATARAX/VISTARIL) 25 MG tablet, Take 1 tablet (25 mg total) by mouth every 8 (eight) hours as needed for anxiety., Disp: 20 tablet, Rfl: 0   IBUPROFEN PO, Take by mouth., Disp: , Rfl:    LORazepam (ATIVAN) 1 MG tablet, Take 1 mg by mouth every 8 (eight) hours as needed for anxiety., Disp: , Rfl:    norelgestromin-ethinyl estradiol (ORTHO EVRA) 150-35 MCG/24HR transdermal patch, Place 1 patch onto the skin once a week., Disp: , Rfl:    omeprazole (PRILOSEC) 20 MG capsule, Take 1 capsule (20 mg total) by mouth daily., Disp: 30 capsule, Rfl: 0   Allergies  Allergen Reactions   Augmentin [Amoxicillin-Pot Clavulanate] Nausea And Vomiting    Past Medical History:  Diagnosis Date   Anxiety    GERD (gastroesophageal reflux disease)    Panic attack      History reviewed. No pertinent surgical history.  History reviewed. No pertinent family history.  Social History   Tobacco Use   Smoking status: Never   Smokeless tobacco: Never  Substance Use Topics   Alcohol use: No   Drug use: No    ROS   Objective:   Vitals: BP (!) 143/88 (BP  Location: Right Arm)   Pulse 95   Temp 98.7 F (37.1 C) (Oral)   Resp 16   SpO2 97%   Physical Exam Constitutional:      General: She is not in acute distress.    Appearance: Normal appearance. She is well-developed. She is not ill-appearing, toxic-appearing or diaphoretic.  HENT:     Head: Normocephalic and atraumatic.     Nose: Nose normal.     Mouth/Throat:     Mouth: Mucous membranes are moist.  Eyes:     General: No scleral icterus.       Right eye: No discharge.        Left eye: No discharge.     Extraocular Movements: Extraocular movements intact.     Conjunctiva/sclera: Conjunctivae normal.  Neck:     Meningeal: Brudzinski's sign and Kernig's sign absent.  Cardiovascular:     Rate and Rhythm: Normal rate and regular rhythm.     Heart sounds: Normal heart sounds. No murmur heard.    No friction rub. No gallop.  Pulmonary:     Effort: Pulmonary effort is normal. No respiratory distress.     Breath sounds: No stridor. No wheezing, rhonchi or rales.  Chest:     Chest wall: No tenderness.  Musculoskeletal:     Cervical back: Normal range of motion and neck supple. No rigidity.  Lymphadenopathy:     Cervical: No cervical adenopathy.  Skin:  General: Skin is warm and dry.  Neurological:     General: No focal deficit present.     Mental Status: She is alert and oriented to person, place, and time.     Cranial Nerves: No cranial nerve deficit, dysarthria or facial asymmetry.     Motor: No weakness.     Coordination: Coordination normal.     Gait: Gait normal.  Psychiatric:        Mood and Affect: Mood normal.        Behavior: Behavior normal.        Thought Content: Thought content normal.        Judgment: Judgment normal.     Results for orders placed or performed during the hospital encounter of 12/07/21 (from the past 24 hour(s))  POCT urinalysis dipstick     Status: None   Collection Time: 12/07/21  7:49 PM  Result Value Ref Range   Color, UA yellow  yellow   Clarity, UA clear clear   Glucose, UA negative negative mg/dL   Bilirubin, UA negative negative   Ketones, POC UA negative negative mg/dL   Spec Grav, UA 0.277 4.128 - 1.025   Blood, UA negative negative   pH, UA 6.0 5.0 - 8.0   Protein Ur, POC negative negative mg/dL   Urobilinogen, UA 0.2 0.2 or 1.0 E.U./dL   Nitrite, UA Negative Negative   Leukocytes, UA Negative Negative  POCT urine pregnancy     Status: None   Collection Time: 12/07/21  7:50 PM  Result Value Ref Range   Preg Test, Ur Negative Negative    Assessment and Plan :   PDMP not reviewed this encounter.  1. Weakness   2. Encounter for screening for COVID-19   3. Hot flashes   4. Generalized headaches    Deferred imaging given clear cardiopulmonary exam, hemodynamically stable vital signs. No suspicion for an acute encephalopathy, stroke.  Will manage for viral illness such as viral URI, viral syndrome, viral rhinitis, COVID-19. Recommended supportive care. Offered scripts for symptomatic relief. Testing is pending. Counseled patient on potential for adverse effects with medications prescribed/recommended today, ER and return-to-clinic precautions discussed, patient verbalized understanding.     Wallis Bamberg, [place] 12/07/21 1955

## 2021-12-07 NOTE — ED Triage Notes (Signed)
Pt here with weakness, hot/cold flashes, and headaches since her cycle started on Saturday.

## 2021-12-07 NOTE — Discharge Instructions (Addendum)
We will notify you of your test results as they arrive and may take between 24-48 hours.  I encourage you to sign up for MyChart if you have not already done so as this can be the easiest way for us to communicate results to you online or through a phone app.  Generally, we only contact you if it is a positive test result.  In the meantime, if you develop worsening symptoms including fever, chest pain, shortness of breath despite our current treatment plan then please report to the emergency room as this may be a sign of worsening status from possible viral infection.  Otherwise, we will manage this as a viral syndrome. For sore throat or cough try using a honey-based tea. Use 3 teaspoons of honey with juice squeezed from half lemon. Place shaved pieces of ginger into 1/2-1 cup of water and warm over stove top. Then mix the ingredients and repeat every 4 hours as needed. Please take Tylenol 500mg-650mg every 6 hours for aches and pains, fevers. Hydrate very well with at least 2 liters of water. Eat light meals such as soups to replenish electrolytes and soft fruits, veggies. Start an antihistamine like Zyrtec for postnasal drainage, sinus congestion.  You can take this together with pseudoephedrine (Sudafed) at a dose of 60 mg 2-3 times a day as needed for the same kind of congestion.  Use the cough medications as needed.   

## 2021-12-08 DIAGNOSIS — Z131 Encounter for screening for diabetes mellitus: Secondary | ICD-10-CM | POA: Diagnosis not present

## 2021-12-08 DIAGNOSIS — R079 Chest pain, unspecified: Secondary | ICD-10-CM | POA: Diagnosis not present

## 2021-12-09 LAB — SARS CORONAVIRUS 2 (TAT 6-24 HRS): SARS Coronavirus 2: NEGATIVE

## 2022-03-07 ENCOUNTER — Ambulatory Visit
Admission: RE | Admit: 2022-03-07 | Discharge: 2022-03-07 | Disposition: A | Discharge status: Home / Self Care | Payer: 59 | Source: Ambulatory Visit | Attending: Emergency Medicine | Admitting: Emergency Medicine

## 2022-03-07 ENCOUNTER — Other Ambulatory Visit (HOSPITAL_COMMUNITY): Admission: RE | Admit: 2022-03-07 | Payer: 59 | Source: Ambulatory Visit | Admitting: Emergency Medicine

## 2022-03-07 ENCOUNTER — Telehealth: Payer: Self-pay | Admitting: Emergency Medicine

## 2022-03-07 VITALS — BP 124/82 | HR 98 | Temp 98.6°F | Resp 16

## 2022-03-07 DIAGNOSIS — Z8719 Personal history of other diseases of the digestive system: Secondary | ICD-10-CM | POA: Diagnosis not present

## 2022-03-07 DIAGNOSIS — R1031 Right lower quadrant pain: Secondary | ICD-10-CM | POA: Diagnosis not present

## 2022-03-07 DIAGNOSIS — R300 Dysuria: Secondary | ICD-10-CM | POA: Insufficient documentation

## 2022-03-07 DIAGNOSIS — Z113 Encounter for screening for infections with a predominantly sexual mode of transmission: Secondary | ICD-10-CM | POA: Diagnosis not present

## 2022-03-07 LAB — POCT URINALYSIS DIP (MANUAL ENTRY)
Bilirubin, UA: NEGATIVE
Blood, UA: NEGATIVE
Glucose, UA: NEGATIVE mg/dL
Ketones, POC UA: NEGATIVE mg/dL
Nitrite, UA: NEGATIVE
Protein Ur, POC: NEGATIVE mg/dL
Spec Grav, UA: <=1.005 — AB (ref 1.010–1.025)
Urobilinogen, UA: 0.2 E.U./dL
pH, UA: 6 (ref 5.0–8.0)

## 2022-03-07 LAB — POCT URINE PREGNANCY: Preg Test, Ur: NEGATIVE

## 2022-03-07 NOTE — ED Provider Notes (Signed)
[facility]    CSN: [id number] Arrival date & time: 03/07/22  [id number]    HISTORY   Chief Complaint  Patient presents with   Polyuria   Abdominal Pain   HPI Michelle Simon is a pleasant, 26 y.o. female who presents to urgent care today. Patient complains of bladder pressure that began 3 days ago.  Patient states she reached out to her primary care provider who sent her prescription for Bactrim, states she has been taking this for the past 2 days had some improvement in her increased frequency of urination but is now having intermittent pain in her right lower abdomen and right flank.  Patient states pain is intermittent, last 30 seconds and then resolves without intervention.  Patient states she has not had fever, nausea, vomiting, vaginal irritation, fishy odor or vaginal discharge.  Patient states she is sexually active, most recently 5 days ago, does not regularly use condoms due to being in what she feels is a mutually exclusive relationship, states she is using Nexplanon for birth control but it expires next month.  Urine pregnancy test today is negative.  The history is provided by the patient.   Past Medical History:  Diagnosis Date   Anxiety    GERD (gastroesophageal reflux disease)    Panic attack    There are no problems to display for this patient.  Past Surgical History:  Procedure Laterality Date   NO PAST SURGERIES     OB History   No obstetric history on file.    Home Medications    Prior to Admission medications   Medication Sig Start Date End Date Taking? Authorizing Provider  etonogestrel (NEXPLANON) 68 MG IMPL implant 1 each by Subdermal route once.    [provider]  norelgestromin-ethinyl estradiol (ORTHO EVRA) 150-35 MCG/24HR transdermal patch Place 1 patch onto the skin once a week.    [provider]    Family History History reviewed. No pertinent family history. Social History Social History   Tobacco Use    Smoking status: Never   Smokeless tobacco: Never  Vaping Use   Vaping Use: Never used  Substance Use Topics   Alcohol use: Not Currently    Comment: rarely   Drug use: Yes    Types: Marijuana    Comment: rare use   Allergies   Augmentin [amoxicillin-pot clavulanate]  Review of Systems Review of Systems Pertinent findings revealed after performing a 14 point review of systems has been noted in the history of present illness.  Physical Exam Triage Vital Signs ED Triage Vitals  Enc Vitals Group     BP 02/10/21 0827 (!) 147/82     Pulse Rate 02/10/21 0827 72     Resp 02/10/21 0827 18     Temp 02/10/21 0827 98.3 F (36.8 C)     Temp Source 02/10/21 0827 Oral     SpO2 02/10/21 0827 98 %     Weight --      Height --      Head Circumference --      Peak Flow --      Pain Score 02/10/21 0826 5     Pain Loc --      Pain Edu? --      Excl. in GC? --   No data found.  Updated Vital Signs BP 124/82   Pulse 98   Temp 98.6 F (37 C) (Oral)   Resp 16   SpO2 97%   Physical Exam Vitals and  nursing note reviewed.  Constitutional:      General: She is not in acute distress.    Appearance: Normal appearance. She is not ill-appearing.  HENT:     Head: Normocephalic and atraumatic.  Eyes:     General: Lids are normal.        Right eye: No discharge.        Left eye: No discharge.     Extraocular Movements: Extraocular movements intact.     Conjunctiva/sclera: Conjunctivae normal.     Right eye: Right conjunctiva is not injected.     Left eye: Left conjunctiva is not injected.  Neck:     Trachea: Trachea and phonation normal.  Cardiovascular:     Rate and Rhythm: Normal rate and regular rhythm.     Pulses: Normal pulses.     Heart sounds: Normal heart sounds. No murmur heard.    No friction rub. No gallop.  Pulmonary:     Effort: Pulmonary effort is normal. No accessory muscle usage, prolonged expiration or respiratory distress.     Breath sounds: Normal breath sounds.  No stridor, decreased air movement or transmitted upper airway sounds. No decreased breath sounds, wheezing, rhonchi or rales.  Chest:     Chest wall: No tenderness.  Abdominal:     General: Abdomen is flat. Bowel sounds are decreased. There is no distension.     Palpations: Abdomen is soft.     Tenderness: There is abdominal tenderness in the right lower quadrant and suprapubic area. There is no right CVA tenderness, left CVA tenderness, guarding or rebound. Negative signs include Murphy's sign, Rovsing's sign, McBurney's sign, psoas sign and obturator sign.     Hernia: No hernia is present.  Genitourinary:    Comments: Patient politely declines pelvic exam today, patient provided a vaginal swab for testing. Musculoskeletal:        General: Normal range of motion.     Cervical back: Normal range of motion and neck supple. Normal range of motion.  Lymphadenopathy:     Cervical: No cervical adenopathy.  Skin:    General: Skin is warm and dry.     Findings: No erythema or rash.  Neurological:     General: No focal deficit present.     Mental Status: She is alert and oriented to person, place, and time.  Psychiatric:        Mood and Affect: Mood normal.        Behavior: Behavior normal.     Visual Acuity Right Eye Distance:   Left Eye Distance:   Bilateral Distance:    Right Eye Near:   Left Eye Near:    Bilateral Near:     UC Couse / Diagnostics / Procedures:     Radiology No results found.  Procedures Procedures (including critical care time) EKG  Pending results:  Labs Reviewed  POCT URINALYSIS DIP (MANUAL ENTRY) - Abnormal; Notable for the following components:      Result Value   Spec Grav, UA <=1.005 (*)    Leukocytes, UA Trace (*)    All other components within normal limits  URINE CULTURE  POCT URINE PREGNANCY    Medications Ordered in UC: Medications - No data to display  UC Diagnoses / Final Clinical Impressions(s)   I have reviewed the triage vital  signs and the nursing notes.  Pertinent labs & imaging results that were available during my care of the patient were reviewed by me and considered in my medical decision  making (see chart for details).    Final diagnoses:  Right lower quadrant abdominal pain  History of constipation  Screening examination for STD (sexually transmitted disease)  Dysuria    STD screening was performed, patient advised that the results be posted to their MyChart and if any of the results are positive, they will be notified by phone, further treatment will be provided as indicated based on results of STD screening. Patient was advised to abstain from sexual intercourse until that they receive the results of their STD testing.  Patient was also advised to use condoms to protect themselves from STD exposure. Urine pregnancy test was negative. Patient was advised to complete 6 doses of Bactrim and that we will notify her of the results of her urine culture once it is complete, provide further antibiotic treatment if necessary. Return precautions advised.  ED Prescriptions   None    PDMP not reviewed this encounter.  Disposition Upon Discharge:  Condition: stable for discharge home  Patient presented with concern for an acute illness with associated systemic symptoms and significant discomfort requiring urgent management. In my opinion, this is a condition that a prudent lay person (someone who possesses an average knowledge of health and medicine) may potentially expect to result in complications if not addressed urgently such as respiratory distress, impairment of bodily function or dysfunction of bodily organs.   As such, the patient has been evaluated and assessed, work-up was performed and treatment was provided in alignment with urgent care protocols and evidence based medicine.  Patient/parent/caregiver has been advised that the patient may require follow up for further testing and/or treatment if the  symptoms continue in spite of treatment, as clinically indicated and appropriate.  Routine symptom specific, illness specific and/or disease specific instructions were discussed with the patient and/or caregiver at length.  Prevention strategies for avoiding STD exposure were also discussed.  The patient will follow up with their current PCP if and as advised. If the patient does not currently have a PCP we will assist them in obtaining one.   The patient may need specialty follow up if the symptoms continue, in spite of conservative treatment and management, for further workup, evaluation, consultation and treatment as clinically indicated and appropriate.  Patient/parent/caregiver verbalized understanding and agreement of plan as discussed.  All questions were addressed during visit.  Please see discharge instructions below for further details of plan.  Discharge Instructions:   Discharge Instructions      Please finish all of the Bactrim as prescribed.  We will order a culture of your urine to confirm the negative urine dipstick test we performed in the clinic today.  If the result is negative, you can discontinue Bactrim.  You could also consider discontinue Bactrim after completing 3 full days of treatment or 6 doses.  If you are concerned about possibly being "a little backed up", or more constipated than usual, I recommend the following, safe bowel cleanout regimen:  Please purchase 32 ounces of your favorite flavor of Gatorade and mix in 250 g of MiraLAX, also known as polyethylene glycol.  It is not important to buy the brand name, the generic version will do.  When you have mixed them together and the MiraLAX powder is completely dissolved, please consume the entire 32 ounces within 1 hour.  Within a few hours, perhaps a little more or a little less, you should feel a significant urge to move your bowels and be able to produce a significant amount of  stool.  If you feel you have had  significant production of stool there is no need to repeat this process but if you feel that your results have been less than satisfying, you can certainly repeat this process 6 hours after the first round.  The results of your vaginal swab test which screens for BV, yeast, gonorrhea, chlamydia and trichomonas will be made posted to your MyChart account once it is complete.  This typically takes 2 to 4 days.  Please abstain from sexual intercourse of any kind, vaginal, oral or anal, until you have received the results of your STD testing.     If any of your results are abnormal, you will receive a phone call regarding treatment.  Prescriptions, if any are needed, will be provided for you at your pharmacy.     If you have not had complete resolution of your symptoms after completing treatment, please return for repeat evaluation.   Thank you for visiting urgent care today.  I appreciate the opportunity to participate in your care.       This office note has been dictated using Teaching laboratory technician.  Unfortunately, this method of dictation can sometimes lead to typographical or grammatical errors.  I apologize for your inconvenience in advance if this occurs.  Please do not hesitate to reach out to me if clarification is needed.       Theadora Rama Scales, [place] 03/07/22 [phone]

## 2022-03-07 NOTE — ED Triage Notes (Addendum)
C/O bladder pressure onset 3 days ago, states contacted PCP 2 days ago and was sent in Rx for SMZ-TMP DS (has been taking); states was having some improvement in polyuria, but now woke this AM with intermittent pain in right abd and flank area, and worsening pressure in lower abd. Denies fevers, denies n/v. Denies vaginal discharge.

## 2022-03-07 NOTE — Discharge Instructions (Addendum)
Please finish all of the Bactrim as prescribed.  We will order a culture of your urine to confirm the negative urine dipstick test we performed in the clinic today.  If the result is negative, you can discontinue Bactrim.  You could also consider discontinue Bactrim after completing 3 full days of treatment or 6 doses.  If you are concerned about possibly being "a little backed up", or more constipated than usual, I recommend the following, safe bowel cleanout regimen:  Please purchase 32 ounces of your favorite flavor of Gatorade and mix in 250 g of MiraLAX, also known as polyethylene glycol.  It is not important to buy the brand name, the generic version will do.  When you have mixed them together and the MiraLAX powder is completely dissolved, please consume the entire 32 ounces within 1 hour.  Within a few hours, perhaps a little more or a little less, you should feel a significant urge to move your bowels and be able to produce a significant amount of stool.  If you feel you have had significant production of stool there is no need to repeat this process but if you feel that your results have been less than satisfying, you can certainly repeat this process 6 hours after the first round.  The results of your vaginal swab test which screens for BV, yeast, gonorrhea, chlamydia and trichomonas will be made posted to your MyChart account once it is complete.  This typically takes 2 to 4 days.  Please abstain from sexual intercourse of any kind, vaginal, oral or anal, until you have received the results of your STD testing.     If any of your results are abnormal, you will receive a phone call regarding treatment.  Prescriptions, if any are needed, will be provided for you at your pharmacy.     If you have not had complete resolution of your symptoms after completing treatment, please return for repeat evaluation.   Thank you for visiting urgent care today.  I appreciate the opportunity to participate  in your care.

## 2022-03-07 NOTE — Telephone Encounter (Signed)
Encounter opened to order vaginal swab test.

## 2022-03-08 LAB — URINE CULTURE: Culture: NO GROWTH

## 2022-03-08 LAB — CERVICOVAGINAL ANCILLARY ONLY
Bacterial Vaginitis (gardnerella): NEGATIVE
Candida Glabrata: NEGATIVE
Candida Vaginitis: NEGATIVE
Chlamydia: NEGATIVE
Comment: NEGATIVE
Comment: NEGATIVE
Comment: NEGATIVE
Comment: NEGATIVE
Comment: NEGATIVE
Comment: NORMAL
Neisseria Gonorrhea: NEGATIVE
Trichomonas: NEGATIVE

## 2022-03-28 DIAGNOSIS — R109 Unspecified abdominal pain: Secondary | ICD-10-CM | POA: Diagnosis not present

## 2022-04-26 DIAGNOSIS — F419 Anxiety disorder, unspecified: Secondary | ICD-10-CM | POA: Diagnosis not present

## 2022-05-01 DIAGNOSIS — F419 Anxiety disorder, unspecified: Secondary | ICD-10-CM | POA: Diagnosis not present

## 2022-05-03 DIAGNOSIS — R109 Unspecified abdominal pain: Secondary | ICD-10-CM | POA: Diagnosis not present

## 2022-05-03 DIAGNOSIS — Z3046 Encounter for surveillance of implantable subdermal contraceptive: Secondary | ICD-10-CM | POA: Diagnosis not present

## 2022-05-10 DIAGNOSIS — F419 Anxiety disorder, unspecified: Secondary | ICD-10-CM | POA: Diagnosis not present

## 2022-05-15 DIAGNOSIS — F419 Anxiety disorder, unspecified: Secondary | ICD-10-CM | POA: Diagnosis not present

## 2022-05-21 ENCOUNTER — Encounter: Payer: Self-pay | Admitting: Gastroenterology

## 2022-05-22 ENCOUNTER — Encounter: Payer: Self-pay | Admitting: Gastroenterology

## 2022-05-22 ENCOUNTER — Other Ambulatory Visit (INDEPENDENT_AMBULATORY_CARE_PROVIDER_SITE_OTHER): Payer: 59

## 2022-05-22 ENCOUNTER — Ambulatory Visit (INDEPENDENT_AMBULATORY_CARE_PROVIDER_SITE_OTHER): Payer: 59 | Admitting: Gastroenterology

## 2022-05-22 ENCOUNTER — Other Ambulatory Visit (HOSPITAL_COMMUNITY): Payer: Self-pay

## 2022-05-22 VITALS — BP 122/78 | HR 93 | Ht 63.0 in | Wt 259.1 lb

## 2022-05-22 DIAGNOSIS — K5909 Other constipation: Secondary | ICD-10-CM

## 2022-05-22 DIAGNOSIS — R140 Abdominal distension (gaseous): Secondary | ICD-10-CM

## 2022-05-22 DIAGNOSIS — R109 Unspecified abdominal pain: Secondary | ICD-10-CM

## 2022-05-22 DIAGNOSIS — K30 Functional dyspepsia: Secondary | ICD-10-CM

## 2022-05-22 LAB — COMPREHENSIVE METABOLIC PANEL
ALT: 8 U/L (ref 0–35)
AST: 10 U/L (ref 0–37)
Albumin: 4.2 g/dL (ref 3.5–5.2)
Alkaline Phosphatase: 55 U/L (ref 39–117)
BUN: 9 mg/dL (ref 6–23)
CO2: 23 mEq/L (ref 19–32)
Calcium: 8.9 mg/dL (ref 8.4–10.5)
Chloride: 104 mEq/L (ref 96–112)
Creatinine, Ser: 0.66 mg/dL (ref 0.40–1.20)
GFR: 121.55 mL/min (ref >60.00)
Glucose, Bld: 86 mg/dL (ref 70–99)
Potassium: 3.7 mEq/L (ref 3.5–5.1)
Sodium: 138 mEq/L (ref 135–145)
Total Bilirubin: 0.4 mg/dL (ref 0.2–1.2)
Total Protein: 8 g/dL (ref 6.0–8.3)

## 2022-05-22 LAB — CBC WITH DIFFERENTIAL/PLATELET
Basophils Absolute: 0 10*3/uL (ref 0.0–0.1)
Basophils Relative: 1.1 % (ref 0.0–3.0)
Eosinophils Absolute: 0.1 10*3/uL (ref 0.0–0.7)
Eosinophils Relative: 1.7 % (ref 0.0–5.0)
HCT: 39.7 % (ref 36.0–46.0)
Hemoglobin: 13.1 g/dL (ref 12.0–15.0)
Lymphocytes Relative: 48.9 % — ABNORMAL HIGH (ref 12.0–46.0)
Lymphs Abs: 1.9 10*3/uL (ref 0.7–4.0)
MCHC: 33 g/dL (ref 30.0–36.0)
MCV: 75.9 fl — ABNORMAL LOW (ref 78.0–100.0)
Monocytes Absolute: 0.5 10*3/uL (ref 0.1–1.0)
Monocytes Relative: 12.6 % — ABNORMAL HIGH (ref 3.0–12.0)
Neutro Abs: 1.4 10*3/uL (ref 1.4–7.7)
Neutrophils Relative %: 35.7 % — ABNORMAL LOW (ref 43.0–77.0)
Platelets: 261 10*3/uL (ref 150.0–400.0)
RBC: 5.23 Mil/uL — ABNORMAL HIGH (ref 3.87–5.11)
RDW: 13.6 % (ref 11.5–15.5)
WBC: 3.8 10*3/uL — ABNORMAL LOW (ref 4.0–10.5)

## 2022-05-22 LAB — H. PYLORI ANTIBODY, IGG: H Pylori IgG: NEGATIVE

## 2022-05-22 LAB — TSH: TSH: 1.72 u[IU]/mL (ref 0.35–5.50)

## 2022-05-22 MED ORDER — POLYETHYLENE GLYCOL 3350 17 G PO PACK
17.0000 g | PACK | Freq: Every day | ORAL | 0 refills | Status: DC | PRN
Start: 2022-05-22 — End: 2022-11-02

## 2022-05-22 MED ORDER — IBGARD 90 MG PO CPCR: ORAL_CAPSULE | ORAL | 0 refills | Status: DC

## 2022-05-22 MED ORDER — FAMOTIDINE 20 MG PO TABS
20.0000 mg | ORAL_TABLET | Freq: Every day | ORAL | 1 refills | Status: DC
  Filled 2022-05-22: qty 90, 90d supply, fill #0

## 2022-05-22 NOTE — Progress Notes (Signed)
HPI :  27 year old female with a history of GERD, chronic constipation, anxiety, BMI greater than 40, here to establish care for constipation, abdominal discomfort.  Patient states she has had constipation for years that has bothered her, however in recent months her symptoms have been a bit worse.  She typically will have a bowel movement perhaps for days out of a typical week, can be hard to get stools that at times, stools formed can be hard.  No blood in her stool but she has some blood when she wipes herself at times on the toilet paper which is a scant amount.  She has tried some dietary changes such as cutting out dairy which can make things worse.  Specifically eating cheese can make her bowels worse.  Associated with her constipation she experiences intermittent lower abdominal pains.  Typically after she moves her bowels she can have relief of her abdominal pain.  More recently eating [place] food hibachi has made her symptoms worse, she had a diffuse abdominal cramping, felt like she had taken a laxative.  Her symptoms in general have been worse since the end of November.  She states she saw her her primary and thought she may have had a UTI or problems with fibroids, reportedly had a pelvic ultrasound last year which did not show anything too concerning.  She has tried using some Dulcolax a few weeks ago which helped her move her bowels and she felt a bit better.  She has had some occasional nausea that can come with her constipation.  No vomiting.  She feels bloating frequently and a diffuse sense of fullness.  She is also had some rectal pressure at times they can be relieved with a bowel movement.  More recently at the end of November, she experienced some right upper quadrant pain as well as a few days ago.  She states it can come and go, it appeared to be more of a sudden onset and then eases off.  She thought it felt like a gas pain and was relieved with a bowel movement.  She denies any  postprandial upper abdominal pains.  At baseline she really does not take much of anything for her bowels.  Has been using Colace and Dulcolax mostly as needed.  Of note on her medication record I see Elavil is that she takes nightly for sleep and anxiety.  She states she does not take it daily and perhaps only a few times per month.  She has never had a prior colonoscopy.  She denies any family history of colon cancer, Crohn's disease, celiac disease etc.  She does have a history of reflux that can bother her at times, she has been belching as well that has been bothering her.  She was a bit tearful in the interview today when I entered the room..   Past Medical History:  Diagnosis Date   Anxiety    Chronic constipation    GERD (gastroesophageal reflux disease)    Morbid obesity ([place])    Panic attack      Past Surgical History:  Procedure Laterality Date   NO PAST SURGERIES     Family History  Problem Relation Age of Onset   Hypertension Mother    Diabetes Mother    CVA Mother    Heart disease Father    Diabetes Father    Cirrhosis Father    Diabetes Maternal Grandmother    Stomach cancer Neg Hx    Colon cancer Neg  Hx    Rectal cancer Neg Hx    Pancreatic cancer Neg Hx    Esophageal cancer Neg Hx    Social History   Tobacco Use   Smoking status: Never   Smokeless tobacco: Never  Vaping Use   Vaping Use: Never used  Substance Use Topics   Alcohol use: Not Currently    Comment: rarely   Drug use: Not Currently    Types: Marijuana   Current Outpatient Medications  Medication Sig Dispense Refill   famotidine (PEPCID) 20 MG tablet Take 1 tablet (20 mg total) by mouth daily. 90 tablet 1   Peppermint Oil (IBGARD) 90 MG CPCR Take as directed 12 capsule 0   polyethylene glycol (MIRALAX) 17 g packet Take 17 g by mouth daily as needed. 14 each 0   AMITRIPTYLINE HCL PO Take by mouth. TAKES PRN     Etonogestrel (NEXPLANON [place]) Inject into the skin.     etonogestrel  (NEXPLANON) 68 MG IMPL implant 1 each by Subdermal route once.     LORAZEPAM PO Take by mouth. TAKES PRN     No current facility-administered medications for this visit.   Allergies  Allergen Reactions   Augmentin [Amoxicillin-Pot Clavulanate] Nausea And Vomiting     Review of Systems: All systems reviewed and negative except where noted in HPI.   Lab Results  Component Value Date   WBC 3.8 (L) 05/22/2022   HGB 13.1 05/22/2022   HCT 39.7 05/22/2022   MCV 75.9 (L) 05/22/2022   PLT 261.0 05/22/2022    Lab Results  Component Value Date   CREATININE 0.66 05/22/2022   BUN 9 05/22/2022   NA 138 05/22/2022   K 3.7 05/22/2022   CL 104 05/22/2022   CO2 23 05/22/2022    Lab Results  Component Value Date   ALT 8 05/22/2022   AST 10 05/22/2022   ALKPHOS 55 05/22/2022   BILITOT 0.4 05/22/2022     Physical Exam: BP 122/78   Pulse 93   Ht 5\' 3"  (1.6 m)   Wt 259 lb 2 oz (117.5 kg)   SpO2 100%   BMI 45.90 kg/m  Constitutional: Pleasant,well-developed, female in no acute distress. HEENT: Normocephalic and atraumatic. Conjunctivae are normal. No scleral icterus. Neck supple.  Cardiovascular: Normal rate, regular rhythm.  Pulmonary/chest: Effort normal and breath sounds normal.  Abdominal: Soft, nondistended, nontender.  There are no masses palpable.  DRE - [place] as standby - normal tone, no mass lesions, no fissure, normal squeeze pressure, normal decent Extremities: no edema Lymphadenopathy: No cervical adenopathy noted. Neurological: Alert and oriented to person place and time. Skin: Skin is warm and dry. No rashes noted. Psychiatric: Normal mood and affect. Behavior is normal.   ASSESSMENT: 27 y.o. female here for assessment of the following  1. Chronic constipation   2. Bloating   3. Abdominal pain, unspecified abdominal location   4. Indigestion    As above, chronic constipation with worsening in recent months associate with lower abdominal pain and  bloating.  She is also had some indigestion with this.  My impression is that constipation could be driving a lot of her symptoms, and that she typically finds some relief with a bowel movement.  If we can get her bowels moving more regularly I suspect her bloating and discomfort should improve.  We discussed some options to treat this, recommend starting with some MiraLAX daily and she can titrate this up as needed to goal of 1 bowel  movement per day.  I gave her some samples of IBgard to use as needed for bloating and gas pains to see if that will help as well.  Will check CBC, CMET, thyroid to make sure normal/stable.  I also will add H. pylori IgG serology in light of some of her mild upper symptoms to make sure negative.  She has some mild reflux symptoms, will start some Pepcid 20 mg daily as needed for this.  I reassured her that hopefully nothing concerning is driving this process and that we can treat constipation with interval improvement.  However I would like her to touch base with me in a few weeks for reassessment and let me know how she is doing on the regimen.  If she is not getting better we will consider further workup, otherwise await her labs.  She agrees  PLAN: - lab for CBC, CMET, TSH, H pylori IgG - start Miralax daily and titrate up as needed - IB gard samples to use PRN for bloating / pain - start pepcid 20mg  q day - contact me in a few weeks for reassessment - consider further workup if symptoms persist  Jolly Mango, MD [facility]  CC: Sherlyn Hay, *

## 2022-05-22 NOTE — Patient Instructions (Addendum)
If you are age 26 or older, your body mass index should be between 23-30. Your Body mass index is 45.9 kg/m. If this is out of the aforementioned range listed, please consider follow up with your Primary Care Provider.  If you are age 35 or younger, your body mass index should be between 19-25. Your Body mass index is 45.9 kg/m. If this is out of the aformentioned range listed, please consider follow up with your Primary Care Provider.   ________________________________________________________   Please go to the lab in the basement of our building to have lab work done as you leave today. Hit "B" for basement when you get on the elevator.  When the doors open the lab is on your left.  We will call you with the results. Thank you.   Please purchase the following medications over the counter and take as directed: Miralax: Use once daily and titrate as needed  We have given you samples of the following medication to take: IBGard - use as directed Miralax  We have sent the following medications to your pharmacy for you to pick up at your convenience: Pepcid 20 mg: Take once daily  Please contact us in a couple of weeks with an update: [phone] or use Mychart.  Thank you for entrusting me with your care and for choosing [facility], Dr. Minier Cellar

## 2022-05-23 ENCOUNTER — Other Ambulatory Visit: Payer: Self-pay

## 2022-05-23 DIAGNOSIS — R718 Other abnormality of red blood cells: Secondary | ICD-10-CM

## 2022-05-24 ENCOUNTER — Other Ambulatory Visit (INDEPENDENT_AMBULATORY_CARE_PROVIDER_SITE_OTHER): Payer: 59

## 2022-05-24 ENCOUNTER — Telehealth: Payer: Self-pay | Admitting: Gastroenterology

## 2022-05-24 DIAGNOSIS — R718 Other abnormality of red blood cells: Secondary | ICD-10-CM

## 2022-05-24 DIAGNOSIS — F419 Anxiety disorder, unspecified: Secondary | ICD-10-CM | POA: Diagnosis not present

## 2022-05-24 LAB — IBC + FERRITIN
Ferritin: 66.7 ng/mL (ref 10.0–291.0)
Iron: 62 ug/dL (ref 42–145)
Saturation Ratios: 19.5 % — ABNORMAL LOW (ref 20.0–50.0)
TIBC: 317.8 ug/dL (ref 250.0–450.0)
Transferrin: 227 mg/dL (ref 212.0–360.0)

## 2022-05-24 NOTE — Telephone Encounter (Signed)
Returned call to patient. Pt stated that she was calling to provide an update on how she has been feeling. Pt states that she has been taking Pepcid 20 mg daily, IB Gard PRN, and Miralax BID. Pt states that Miralax BID seems to help her, she had about 2-3 BM's today. Pt reports that she has a dull pain under her breasts, and intermittent pain in her lower abdomen. The pain does abate after a bowel movement. Pt states that initially the [place] worked immediately but today it didn't seem to work as well. Pt wanted to know if the lab worked checked on her gallbladder, I informed her that it did not. I informed patient that if she had vomiting or nausea after eating large fatty meals then it is possible to be gallbladder related. Pt states that she had a few episodes of nausea when she ate hibachi and chinese on two separate occasions. I expressed to patient that she should give the medicine some more time to work in her system, I informed her that sometimes it takes a couple of weeks to notice improvements in symptoms. Pt stated that she thought waiting two weeks was too long. She stated having the spasms is not normal for her. I told pt that I would send you an update, she seems very anxious about this. Please advise, thanks.

## 2022-05-24 NOTE — Telephone Encounter (Signed)
PT is calling to speak to someone about how she's been feeling as well as her labs. Please advise.

## 2022-05-24 NOTE — Telephone Encounter (Signed)
I agree with you [place], I saw her less than 48 hours ago.  She needs to give it more time for the medications to work.  I would give it at least 1 to 2 weeks and she can let us know how she is feeling at that time.  Her labs are reassuring.  If she continues to feel poorly despite the medicines after few weeks then we can consider further evaluation but it is too soon to make any decisions about further management when she just started therapy.  Thanks

## 2022-05-25 NOTE — Telephone Encounter (Signed)
Called and spoke with patient regarding Dr. Armbruster's recommendations. Pt verbalized understanding and had no concerns at the end of the call. ?

## 2022-05-29 ENCOUNTER — Other Ambulatory Visit (HOSPITAL_COMMUNITY): Payer: Self-pay | Admitting: Family Medicine

## 2022-05-29 DIAGNOSIS — R1011 Right upper quadrant pain: Secondary | ICD-10-CM

## 2022-05-29 DIAGNOSIS — F419 Anxiety disorder, unspecified: Secondary | ICD-10-CM | POA: Diagnosis not present

## 2022-06-01 ENCOUNTER — Ambulatory Visit (HOSPITAL_COMMUNITY): Payer: 59

## 2022-06-01 ENCOUNTER — Ambulatory Visit (HOSPITAL_COMMUNITY)
Admission: RE | Admit: 2022-06-01 | Discharge: 2022-06-01 | Disposition: A | Discharge status: Home / Self Care | Payer: 59 | Source: Ambulatory Visit | Attending: Family Medicine | Admitting: Family Medicine

## 2022-06-01 DIAGNOSIS — R1011 Right upper quadrant pain: Secondary | ICD-10-CM | POA: Insufficient documentation

## 2022-06-01 DIAGNOSIS — R109 Unspecified abdominal pain: Secondary | ICD-10-CM | POA: Diagnosis not present

## 2022-06-05 DIAGNOSIS — F419 Anxiety disorder, unspecified: Secondary | ICD-10-CM | POA: Diagnosis not present

## 2022-06-06 ENCOUNTER — Telehealth: Payer: Self-pay | Admitting: Gastroenterology

## 2022-06-06 MED ORDER — OMEPRAZOLE 20 MG PO CPDR: 20.0000 mg | DELAYED_RELEASE_CAPSULE | Freq: Every day | ORAL | 0 refills | Status: DC

## 2022-06-06 NOTE — Telephone Encounter (Signed)
Inbound call from pt, wanted to speak with you to give you an update of how she is been feeling since she was here last time 05/24/22. Thanks

## 2022-06-06 NOTE — Telephone Encounter (Signed)
Okay thanks. If the Miralax has treated her constipation and happy with it she should continue it. If she is not happy with constipation regimen let me know we can try other things.   In regards to her other symptoms, if somewhat improved but not resolved, let's switch pepcid to prilosec 19m OTC, she can get this OTC, take once daily for 30 days and see how she does. May be good to book her a follow up with me next month or so if she doesn't already have one scheduled. thanks

## 2022-06-06 NOTE — Telephone Encounter (Signed)
Returned call to patient. Pt reports that she has been titrating Miralax to have BM daily (she has tried BID and every other day). Pt states that the [place] helped a little bit, but symptoms didn't completely go away. Pt took 2 IB Gard capsules daily. Pt states that she has been taking Pepcid 20 mg daily since it has been prescribed, it has helped decrease her burping. Pt reports that she still has discomfort under her breasts, she describes as "an achy pain". She states that the pain did radiate to her back]. Pt states that she noticed the discomfort last Friday. She states that eating does make the discomfort worse, she states that it feels like "something is coming back up". She is not having vomiting. She states that her MD ordered a RUQ [place] and her gallbladder was normal. I told pt that you may have her increase her Pepcid to BID for a little while to see if symptoms improve. Please advise, thanks.

## 2022-06-06 NOTE — Telephone Encounter (Signed)
Called and spoke with patient regarding recommendations. Pt will continue Miralax for constipation. Pt has been advised to stop Pepcid and purchase Prilosec OTC and take 20 mg daily for a month. Pt has been advised to call us with an update after. Pt has been scheduled for a follow up on Monday, 08/06/22 at 3:20 pm. Pt verbalized understanding and had no concerns at the end of the call.

## 2022-06-12 DIAGNOSIS — F419 Anxiety disorder, unspecified: Secondary | ICD-10-CM | POA: Diagnosis not present

## 2022-06-19 ENCOUNTER — Ambulatory Visit: Payer: Commercial Managed Care - PPO | Admitting: Gastroenterology

## 2022-06-19 DIAGNOSIS — F419 Anxiety disorder, unspecified: Secondary | ICD-10-CM | POA: Diagnosis not present

## 2022-06-26 DIAGNOSIS — F419 Anxiety disorder, unspecified: Secondary | ICD-10-CM | POA: Diagnosis not present

## 2022-07-03 DIAGNOSIS — F419 Anxiety disorder, unspecified: Secondary | ICD-10-CM | POA: Diagnosis not present

## 2022-07-04 ENCOUNTER — Other Ambulatory Visit (HOSPITAL_COMMUNITY): Payer: Self-pay

## 2022-07-04 MED ORDER — SULFAMETHOXAZOLE-TRIMETHOPRIM 800-160 MG PO TABS
1.0000 | ORAL_TABLET | Freq: Two times a day (BID) | ORAL | 0 refills | Status: DC
  Filled 2022-07-04: qty 6, 3d supply, fill #0

## 2022-07-04 MED ORDER — AMITRIPTYLINE HCL 50 MG PO TABS
50.0000 mg | ORAL_TABLET | Freq: Every evening | ORAL | 1 refills | Status: AC
Start: 2022-07-03 — End: ?
  Filled 2022-07-04: qty 90, 90d supply, fill #0

## 2022-07-10 DIAGNOSIS — F419 Anxiety disorder, unspecified: Secondary | ICD-10-CM | POA: Diagnosis not present

## 2022-07-13 DIAGNOSIS — Z131 Encounter for screening for diabetes mellitus: Secondary | ICD-10-CM | POA: Diagnosis not present

## 2022-07-13 DIAGNOSIS — Z0000 Encounter for general adult medical examination without abnormal findings: Secondary | ICD-10-CM | POA: Diagnosis not present

## 2022-07-13 DIAGNOSIS — E559 Vitamin D deficiency, unspecified: Secondary | ICD-10-CM | POA: Diagnosis not present

## 2022-07-13 DIAGNOSIS — R079 Chest pain, unspecified: Secondary | ICD-10-CM | POA: Diagnosis not present

## 2022-07-17 ENCOUNTER — Other Ambulatory Visit (HOSPITAL_COMMUNITY): Payer: Self-pay

## 2022-07-17 MED ORDER — ERGOCALCIFEROL 1.25 MG (50000 UT) PO CAPS
1.0000 | ORAL_CAPSULE | ORAL | 0 refills | Status: AC
Start: 2022-07-17 — End: ?
  Filled 2022-07-17: qty 12, 84d supply, fill #0

## 2022-07-18 DIAGNOSIS — F419 Anxiety disorder, unspecified: Secondary | ICD-10-CM | POA: Diagnosis not present

## 2022-07-24 DIAGNOSIS — F419 Anxiety disorder, unspecified: Secondary | ICD-10-CM | POA: Diagnosis not present

## 2022-08-06 ENCOUNTER — Other Ambulatory Visit (HOSPITAL_COMMUNITY): Payer: Self-pay

## 2022-08-06 ENCOUNTER — Encounter: Payer: Self-pay | Admitting: Gastroenterology

## 2022-08-06 ENCOUNTER — Ambulatory Visit: Payer: 59 | Admitting: Gastroenterology

## 2022-08-06 VITALS — BP 126/78 | HR 71 | Ht 63.0 in | Wt 265.1 lb

## 2022-08-06 DIAGNOSIS — R110 Nausea: Secondary | ICD-10-CM | POA: Diagnosis not present

## 2022-08-06 DIAGNOSIS — R140 Abdominal distension (gaseous): Secondary | ICD-10-CM | POA: Diagnosis not present

## 2022-08-06 DIAGNOSIS — K5909 Other constipation: Secondary | ICD-10-CM | POA: Diagnosis not present

## 2022-08-06 DIAGNOSIS — K219 Gastro-esophageal reflux disease without esophagitis: Secondary | ICD-10-CM

## 2022-08-06 DIAGNOSIS — R1011 Right upper quadrant pain: Secondary | ICD-10-CM | POA: Diagnosis not present

## 2022-08-06 MED ORDER — ONDANSETRON 4 MG PO TBDP
4.0000 mg | ORAL_TABLET | Freq: Four times a day (QID) | ORAL | 1 refills | Status: DC | PRN
  Filled 2022-08-06: qty 30, 8d supply, fill #0

## 2022-08-06 NOTE — Patient Instructions (Signed)
If your blood pressure at your visit was 140/90 or greater, please contact your primary care physician to follow up on this. ______________________________________________________  If you are age 26 or older, your body mass index should be between 23-30. Your Body mass index is 46.96 kg/m. If this is out of the aforementioned range listed, please consider follow up with your Primary Care Provider.  If you are age 79 or younger, your body mass index should be between 19-25. Your Body mass index is 46.96 kg/m. If this is out of the aformentioned range listed, please consider follow up with your Primary Care Provider.  ________________________________________________________  The [facility] providers would like to encourage you to use [facility] to communicate with providers for non-urgent requests or questions.  Due to long hold times on the telephone, sending your provider a message by [facility] may be a faster and more efficient way to get a response.  Please allow 48 business hours for a response.  Please remember that this is for non-urgent requests.  _______________________________________________________  Due to recent changes in healthcare laws, you may see the results of your imaging and laboratory studies on MyChart before your provider has had a chance to review them.  We understand that in some cases there may be results that are confusing or concerning to you. Not all laboratory results come back in the same time frame and the provider may be waiting for multiple results in order to interpret others.  Please give [place] 48 hours in order for your provider to thoroughly review all the results before contacting the office for clarification of your results.   We have sent the following medications to your pharmacy for you to pick up at your convenience: Zofran 4 mg ODT: dissolve 1 orally every 6 hours as needed  Continue present medications.  Thank you for entrusting me with your care and for  choosing [facility], Dr. Ileene Patrick

## 2022-08-06 NOTE — Progress Notes (Signed)
HPI :  27 year old female here for a follow-up visit.  Recall I previously saw her in February with issues of GERD /indigestion, chronic constipation and bloating.  Please see that intake visit for full history.  Since that visit she was placed on MiraLAX daily and told to try titrate up as needed.  She states she has been doing this anywhere from once daily to twice daily and this has definitely helped her bowel frequency.  Before doing this she was having a bowel movement perhaps 4 days out of the week.  Now she is having a bowel movement anywhere from once daily to a few times daily.  This has helped her bloating and her stomach is feeling better on this regimen.  She has been using IBgard as needed for cramps and discomfort and that does provide help at times.  She has been trying to cut out dairy and avoid food triggers.  We had done some basic labs for her which did not show anything concerning.  She tested negative for H. pylori.  I placed her on some Pepcid for reflux and indigestion.  She states initially it did not really help her too much, she also started developing some right upper quadrant discomfort that can sometimes be related to the types of food she ate.  We switch her to omeprazole 20 mg daily.  She took this every day for a good 2 months and that resolved most of her symptoms of her upper tract.  She states she felt much better on that regimen, she has tried to avoid that routinely now and just using it as needed.  She is also been using some Tums as needed which also helps.  She did have a right upper quadrant ultrasound which showed no evidence of gallstones.  Generally she is pretty happy with the regimen, feeling better, occasionally having some lower abdominal discomfort at times but overall pleased with how she is been doing lately.  She did go to [place] for vacation, was eating some foods that she normally does not have and her reflux got a bit worse, trying to get back on  her regular diet now that she is back in the area.  She has had some occasional nausea with her menstrual cycle this week   H pylori IgG negative  RUQ [place] 06/01/22: normal gallbladder  Lab Results  Component Value Date   WBC 3.8 (L) 05/22/2022   HGB 13.1 05/22/2022   HCT 39.7 05/22/2022   MCV 75.9 (L) 05/22/2022   PLT 261.0 05/22/2022    Lab Results  Component Value Date   CREATININE 0.66 05/22/2022   BUN 9 05/22/2022   NA 138 05/22/2022   K 3.7 05/22/2022   CL 104 05/22/2022   CO2 23 05/22/2022    Lab Results  Component Value Date   ALT 8 05/22/2022   AST 10 05/22/2022   ALKPHOS 55 05/22/2022   BILITOT 0.4 05/22/2022   Lab Results  Component Value Date   IRON 62 05/24/2022   TIBC 317.8 05/24/2022   FERRITIN 66.7 05/24/2022      Past Medical History:  Diagnosis Date   Anxiety    Chronic constipation    GERD (gastroesophageal reflux disease)    Morbid obesity    Panic attack      Past Surgical History:  Procedure Laterality Date   NO PAST SURGERIES     Family History  Problem Relation Age of Onset   Hypertension Mother  Diabetes Mother    CVA Mother    Heart disease Father    Diabetes Father    Cirrhosis Father    Diabetes Maternal Grandmother    Stomach cancer Neg Hx    Colon cancer Neg Hx    Rectal cancer Neg Hx    Pancreatic cancer Neg Hx    Esophageal cancer Neg Hx    Social History   Tobacco Use   Smoking status: Never   Smokeless tobacco: Never  Vaping Use   Vaping Use: Never used  Substance Use Topics   Alcohol use: Not Currently    Comment: rarely   Drug use: Not Currently    Types: Marijuana   Current Outpatient Medications  Medication Sig Dispense Refill   amitriptyline (ELAVIL) 50 MG tablet Take 1 tablet (50 mg total) by mouth at bedtime. 90 tablet 1   AMITRIPTYLINE HCL PO Take by mouth. TAKES PRN     ergocalciferol (VITAMIN D2) 1.25 MG (50000 UT) capsule Take 1 capsule (50,000 Units total) by mouth once a week. 12  capsule 0   Etonogestrel (NEXPLANON [place]) Inject into the skin.     etonogestrel (NEXPLANON) 68 MG IMPL implant 1 each by Subdermal route once.     LORAZEPAM PO Take by mouth. TAKES PRN     omeprazole (PRILOSEC) 20 MG capsule Take 1 capsule (20 mg total) by mouth daily. 30 capsule 0   Peppermint Oil (IBGARD) 90 MG CPCR Take as directed 12 capsule 0   polyethylene glycol (MIRALAX) 17 g packet Take 17 g by mouth daily as needed. 14 each 0   sulfamethoxazole-trimethoprim (BACTRIM [place]) 800-160 MG tablet Take 1 tablet by mouth 2 (two) times daily. 6 tablet 0   No current facility-administered medications for this visit.   Allergies  Allergen Reactions   Augmentin [Amoxicillin-Pot Clavulanate] Nausea And Vomiting     Review of Systems: All systems reviewed and negative except where noted in HPI.   Labs per HPI  Physical Exam: BP 126/78 (BP Location: Left Arm, Patient Position: Sitting)   Pulse 71   Ht 5\' 3"  (1.6 m)   Wt 265 lb 2 oz (120.3 kg)   SpO2 99%   BMI 46.96 kg/m  Constitutional: Pleasant,well-developed, female in no acute distress. Neurological: Alert and oriented to person place and time. Psychiatric: Normal mood and affect. Behavior is normal.   ASSESSMENT: 27 y.o. female here for assessment of the following  1. Gastroesophageal reflux disease, unspecified whether esophagitis present   2. RUQ pain   3. Chronic constipation   4. Bloating   5. Nausea without vomiting    Generally doing much better since have last seen her.  I think she has reflux and dyspepsia causing her upper tract symptoms.  Right upper quadrant pain was evaluated with an ultrasound and negative.  Test negative for H. pylori.  She has improved with omeprazole.  We discussed long-term plan.  She can go back on omeprazole daily if needed to minimize her symptoms, depends on how much this bothers her.  We discussed the long-term risks of chronic PPI use and she understands.  Currently taking a few days a  week and Tums as needed occasionally.  I think that is fine if this regimen works for her.  If she finds she has more frequent symptoms she can use omeprazole more frequently/daily.  She will try to avoid known food triggers.  She has been having some nausea associated with her menses this week.  I  will give her some Zofran to use as needed for that.  Reassured her of labs and workup so far.  Otherwise she is responding quite well to the constipation regimen, will continue MiraLAX daily or twice daily and titrate as needed.  Continue IBgard as needed.  We discussed potentially using Bentyl for spasms although she is pretty happy with IBgard and Bentyl could make constipation worse, she will hold off on that for now.  PLAN: - improved on omeprazole - counseled on long term risks of chronic PPIs - use lowest dose needed to control symptoms, use PRN - added TUMS PRN for breakthrough - normal [place], think unlikely biliary colic and more likely dyspepsia / GERD causing symptoms - continue Miralax daily - titrate up or down as needed - continue IB gard PRN - Zofran  ODT every 6 hours PRN  - f/u yearly or PRN in the interim  Harlin Rain, MD [facility]

## 2022-08-07 DIAGNOSIS — F419 Anxiety disorder, unspecified: Secondary | ICD-10-CM | POA: Diagnosis not present

## 2022-08-14 DIAGNOSIS — F419 Anxiety disorder, unspecified: Secondary | ICD-10-CM | POA: Diagnosis not present

## 2022-08-15 ENCOUNTER — Other Ambulatory Visit (HOSPITAL_COMMUNITY): Payer: Self-pay

## 2022-08-21 DIAGNOSIS — F419 Anxiety disorder, unspecified: Secondary | ICD-10-CM | POA: Diagnosis not present

## 2022-08-28 DIAGNOSIS — F419 Anxiety disorder, unspecified: Secondary | ICD-10-CM | POA: Diagnosis not present

## 2022-08-30 ENCOUNTER — Ambulatory Visit: Payer: 59 | Admitting: Sports Medicine

## 2022-08-30 VITALS — BP 112/81 | Ht 63.0 in | Wt 255.0 lb

## 2022-08-30 DIAGNOSIS — M25552 Pain in left hip: Secondary | ICD-10-CM

## 2022-08-30 NOTE — Progress Notes (Signed)
   Subjective:    Patient ID: Michelle Simon, female    DOB: 1995/06/05, 27 y.o.   MRN: [id number]  HPI  Patient is a 27 year old female with a history of upper back/neck hypertonicity who presents to clinic today with right lateral buttock/hip pain that began after visiting the chiropractor roughly 1 month ago.  Patient began seeing chiropractor twice weekly starting in early April due to upper back/neck "stiffness".  During her trips to chiropractor patient describes receiving HVLA using lumbar roll technique.  After receiving lumbar HVLA patient believes twisting mechanism caused new onset right hip/buttock pain.  She first noticed this pain around April 14, roughly 1 month ago.  She denies any other new activities or exercise that may have caused symptoms.  Since then pain has persisted and is worse when shifting weight to right leg while walking or going up stairs.  She has tried treatment modalities including heat/ice, occasional Tylenol, and some gentle stretching.  Symptoms have been slowly improving over the last couple weeks but have not gone away entirely.  She also describes some mild bilateral leg weakness after receiving HVLA.  This is also improving.  Denies any distinct radicular or neuropathic pain.  Still has normal sensation but does feel like legs are somewhat "weak, almost as if they are asleep". This is also improving.  Primary concern today is her right lateral hip/gluteal pain.  Review of Systems  Negative except as noted above    Objective:   Physical Exam Constitutional:      General: She is not in acute distress.    Appearance: Normal appearance. She is obese.  Musculoskeletal:        General: Tenderness and signs of injury present. No swelling or deformity. Normal range of motion.     Comments: Right Buttocks/Lateral Hip Pain:  Patient denies any associated skin changes.  Denies edema, erythema, or ecchymosis.  Mildly tender with deep palpation over right glue medius.   Range of motion of hip is within normal limits.  No pain elicited with full range of motion.  Patient does have weakness with resisted hip abduction.  No signs of neurovascular compromise.  Patient has positive Trendelenburg sign bilaterally.  Physical exam consistent with glute medius tendinitis/weakness.   Skin:    General: Skin is warm and dry.     Capillary Refill: Capillary refill takes less than 2 seconds.     Findings: No bruising, erythema, lesion or rash.  Neurological:     General: No focal deficit present.     Mental Status: She is alert. Mental status is at baseline.     Motor: Weakness (bilateral glut medius) present.          Assessment & Plan:   1) R glute medius tendinitis/weakness. Recent visit to chiropractor with lumbar roll HVLA likely caused acute stress to glut medius tendon that exacerbated acute on chronic weakness. We will start patient on home exercise program that targets pelvic stability & hip abductors; exercises demonstrated by athletic trainer today in office Continue with gentle stretches Continue with heat/ice as needed Patient can take anti-inflammatories like ibuprofen as needed If symptoms not improve in next 4 weeks she is to return to clinic and we will consider starting patient with formal physical therapy.  Note scribed by Havery Moros, DO, PGY 2 and edited by me. Marsa Aris, DO

## 2022-09-04 DIAGNOSIS — F419 Anxiety disorder, unspecified: Secondary | ICD-10-CM | POA: Diagnosis not present

## 2022-09-06 ENCOUNTER — Ambulatory Visit: Payer: 59 | Admitting: Sports Medicine

## 2022-09-11 DIAGNOSIS — F419 Anxiety disorder, unspecified: Secondary | ICD-10-CM | POA: Diagnosis not present

## 2022-09-15 ENCOUNTER — Other Ambulatory Visit (HOSPITAL_COMMUNITY): Payer: Self-pay

## 2022-09-15 MED ORDER — NITROFURANTOIN MONOHYD MACRO 100 MG PO CAPS
100.0000 mg | ORAL_CAPSULE | Freq: Two times a day (BID) | ORAL | 0 refills | Status: DC
  Filled 2022-09-15: qty 14, 7d supply, fill #0

## 2022-09-18 DIAGNOSIS — F419 Anxiety disorder, unspecified: Secondary | ICD-10-CM | POA: Diagnosis not present

## 2022-09-19 ENCOUNTER — Other Ambulatory Visit (HOSPITAL_COMMUNITY): Payer: Self-pay

## 2022-09-25 DIAGNOSIS — F419 Anxiety disorder, unspecified: Secondary | ICD-10-CM | POA: Diagnosis not present

## 2022-10-02 DIAGNOSIS — F419 Anxiety disorder, unspecified: Secondary | ICD-10-CM | POA: Diagnosis not present

## 2022-10-08 DIAGNOSIS — F419 Anxiety disorder, unspecified: Secondary | ICD-10-CM | POA: Diagnosis not present

## 2022-10-23 DIAGNOSIS — F419 Anxiety disorder, unspecified: Secondary | ICD-10-CM | POA: Diagnosis not present

## 2022-11-02 ENCOUNTER — Encounter: Payer: Self-pay | Admitting: Gastroenterology

## 2022-11-02 ENCOUNTER — Other Ambulatory Visit (HOSPITAL_COMMUNITY): Payer: Self-pay

## 2022-11-02 ENCOUNTER — Ambulatory Visit (INDEPENDENT_AMBULATORY_CARE_PROVIDER_SITE_OTHER): Payer: 59 | Admitting: Gastroenterology

## 2022-11-02 VITALS — BP 124/70 | HR 107 | Ht 63.0 in | Wt 260.4 lb

## 2022-11-02 DIAGNOSIS — R195 Other fecal abnormalities: Secondary | ICD-10-CM

## 2022-11-02 DIAGNOSIS — K5909 Other constipation: Secondary | ICD-10-CM | POA: Diagnosis not present

## 2022-11-02 DIAGNOSIS — K219 Gastro-esophageal reflux disease without esophagitis: Secondary | ICD-10-CM | POA: Diagnosis not present

## 2022-11-02 HISTORY — DX: Morbid (severe) obesity due to excess calories: E66.01

## 2022-11-02 MED ORDER — POLYETHYLENE GLYCOL 3350 17 G PO PACK: 17.0000 g | PACK | Freq: Every day | ORAL | Status: AC

## 2022-11-02 MED ORDER — LINACLOTIDE 145 MCG PO CAPS: 145.0000 ug | ORAL_CAPSULE | Freq: Every day | ORAL | Status: DC

## 2022-11-02 MED ORDER — OMEPRAZOLE 20 MG PO CPDR
20.0000 mg | DELAYED_RELEASE_CAPSULE | Freq: Every day | ORAL | 3 refills | Status: DC
  Filled 2022-11-02: qty 90, 90d supply, fill #0
  Filled 2023-02-24: qty 90, 90d supply, fill #1
  Filled 2023-05-31: qty 90, 90d supply, fill #2

## 2022-11-02 NOTE — Progress Notes (Signed)
HPI :  27 year old female here for a follow-up visit.  Recall I previously saw her in the past with issues of GERD /indigestion, chronic constipation and bloating.   Please see intake visit for full history.   I last saw her in April. She had been on Miralax for constipation and it was working fairly well for her previously. She tried coming off it and stools have been harder to pass. Her stool frequency can depend on what she is eating. Use of dairy can often constipate her, she tries to avoid it. She is interested in considering other options than Miralax but she does tolerate it and it generally works okay for her.   She is concerned she has been intermittent seeing "black specks" in her stools. Stools are normal brown color but can have small specks of black in it. Also she had a BM with a "red tinge" but no overt blood. She shares some pictures she took of it. This has happened on a few occasions recently. She is not sure if it represents black beans she ate at [employer]. She denies use of iron or peptobismol. She is anxious about this. No routine NSAID use.  She had stopped routine use of omeprazole in recent months. She is concerned about long term risks. She has previously been on pepcid which did not work nearly as well. Omeprazole does control her symptoms well when she takes it. Also uses TUMS PRN. Recall she tested negative for H. Pylori in the past and RUQ US showed no gallstones. She can have indigestion and upper abdominal discomfort after she eats at times.    Recall she has a chronic microcytosis but normal iron levels.   H pylori IgG negative   RUQ [place] 06/01/22: normal gallbladder    Past Medical History:  Diagnosis Date   Anxiety    Chronic constipation    GERD (gastroesophageal reflux disease)    Morbid obesity (HCC)    Panic attack      Past Surgical History:  Procedure Laterality Date   NO PAST SURGERIES     Family History  Problem Relation Age of Onset    Hypertension Mother    Diabetes Mother    CVA Mother    Heart disease Father    Diabetes Father    Cirrhosis Father    Diabetes Maternal Grandmother    Stomach cancer Neg Hx    Colon cancer Neg Hx    Rectal cancer Neg Hx    Pancreatic cancer Neg Hx    Esophageal cancer Neg Hx    Social History   Tobacco Use   Smoking status: Never   Smokeless tobacco: Never  Vaping Use   Vaping status: Never Used  Substance Use Topics   Alcohol use: Not Currently    Comment: rarely   Drug use: Not Currently    Types: Marijuana   Current Outpatient Medications  Medication Sig Dispense Refill   amitriptyline (ELAVIL) 50 MG tablet Take 1 tablet (50 mg total) by mouth at bedtime. 90 tablet 1   AMITRIPTYLINE HCL PO Take by mouth. TAKES PRN     ergocalciferol (VITAMIN D2) 1.25 MG (50000 UT) capsule Take 1 capsule (50,000 Units total) by mouth once a week. 12 capsule 0   Etonogestrel (NEXPLANON [employer]) Inject into the skin.     etonogestrel (NEXPLANON) 68 MG IMPL implant 1 each by Subdermal route once.     LORAZEPAM PO Take by mouth. TAKES PRN  nitrofurantoin, macrocrystal-monohydrate, (MACROBID) 100 MG capsule Take 1 capsule (100 mg total) by mouth 2 (two) times daily. 14 capsule 0   omeprazole (PRILOSEC) 20 MG capsule Take 1 capsule (20 mg total) by mouth daily. 30 capsule 0   Peppermint Oil (IBGARD) 90 MG CPCR Take as directed 12 capsule 0   polyethylene glycol (MIRALAX) 17 g packet Take 17 g by mouth daily as needed. 14 each 0   No current facility-administered medications for this visit.   Allergies  Allergen Reactions   Augmentin [Amoxicillin-Pot Clavulanate] Nausea And Vomiting     Review of Systems: All systems reviewed and negative except where noted in HPI.   Lab Results  Component Value Date   WBC 3.8 (L) 05/22/2022   HGB 13.1 05/22/2022   HCT 39.7 05/22/2022   MCV 75.9 (L) 05/22/2022   PLT 261.0 05/22/2022    Lab Results  Component Value Date   NA 138 05/22/2022   CL  104 05/22/2022   K 3.7 05/22/2022   CO2 23 05/22/2022   BUN 9 05/22/2022   CREATININE 0.66 05/22/2022   GFR 121.55 05/22/2022   CALCIUM 8.9 05/22/2022   ALBUMIN 4.2 05/22/2022   GLUCOSE 86 05/22/2022    Lab Results  Component Value Date   IRON 62 05/24/2022   TIBC 317.8 05/24/2022   FERRITIN 66.7 05/24/2022    Lab Results  Component Value Date   ALT 8 05/22/2022   AST 10 05/22/2022   ALKPHOS 55 05/22/2022   BILITOT 0.4 05/22/2022     Physical Exam: BP 124/70   Pulse (!) 107   Ht 5\' 3"  (1.6 m)   Wt 260 lb 6.4 oz (118.1 kg)   SpO2 93%   BMI 46.13 kg/m  Constitutional: Pleasant,well-developed, female in no acute distress. Neurological: Alert and oriented to person place and time. Psychiatric: Normal mood and affect. Behavior is normal.   ASSESSMENT: 27 y.o. female here for assessment of the following  1. Change in stool   2. Chronic constipation   3. Gastroesophageal reflux disease, unspecified whether esophagitis present    Her main concern is seeing these small specks of black in her otherwise normal brown stool. One with hint of red in it from what she describes. Photos reviewed - this does not look like melena or blood, but likely contents of food ingestion. That being said, hard to say for sure, but it seems very unlikely to represent blood based on description and review of photos. I did offer her hemeoccult card to check and see if she passes this again she will submit it. Otherwise in light of her upper tract symptoms we discussed if she would want an EGD for piece of mind as she is anxious about this. We discussed what EGD entails. She wants to hold off on this for now and will keep an eye on things and submit stool first if she has recurrence.   Otherwise, suspect GERD / dyspepsia is causing her symptoms. She responds well to omeprazole but does not want to take it routinely due to concern about risks. We discussed long term risks of chronic PPI use. She has  failed pepcid. Tested negative for H pylori. I think okay to use omeprazole routinely if she feels she needs it due to symptoms, or take it a few days per week. She understands and will use it PRN along with TUMS.  We reviewed her bowel regimen otherwise. Miralax has worked for her but she did not want to  use it routinely. If having hard stools would use it routinely, counseled her this is very safe to use long term. I also provided her some Linzess samples, / day if she would prefer that option. She will try it and let me know.  PLAN: - stool for occult blood x 3 - will submit if she sees blood or concerning findings, but based on description and photos this seems to be likely benign / ingested contents and not blood - Miralax daily but will try Linzess / day, she will let me know if she wants a prescription for Linzess - refill omeprazole 20mg  / day - can use PRN - she feels better on it, did not do well with pepcid. Counseled on long term risks of PPIs. - offered EGD, she is anxious about this, but would also be anxious about the procedure. She wants to hold off. If stools are positive she will be agreeable  Harlin Rain, MD [facility]

## 2022-11-02 NOTE — Patient Instructions (Addendum)
If your blood pressure at your visit was 140/90 or greater, please contact your primary care physician to follow up on this. ______________________________________________________  If you are age 26 or older, your body mass index should be between 23-30. Your Body mass index is 46.13 kg/m. If this is out of the aforementioned range listed, please consider follow up with your Primary Care Provider.  If you are age 52 or younger, your body mass index should be between 19-25. Your Body mass index is 46.13 kg/m. If this is out of the aformentioned range listed, please consider follow up with your Primary Care Provider.  ________________________________________________________  The [facility] providers would like to encourage you to use [facility] to communicate with providers for non-urgent requests or questions.  Due to long hold times on the telephone, sending your provider a message by [facility] may be a faster and more efficient way to get a response.  Please allow 48 business hours for a response.  Please remember that this is for non-urgent requests.  _______________________________________________________  Due to recent changes in healthcare laws, you may see the results of your imaging and laboratory studies on MyChart before your provider has had a chance to review them.  We understand that in some cases there may be results that are confusing or concerning to you. Not all laboratory results come back in the same time frame and the provider may be waiting for multiple results in order to interpret others.  Please give [place] 48 hours in order for your provider to thoroughly review all the results before contacting the office for clarification of your results.    Please purchase the following medications over the counter and take as directed: Miralax: Use as directed once daily.  IF this is not effective.......  We have given you samples of the following medication to take: Linzess 145 mcg: Take once  daily 30 minutes before a meal  NOTE: Linzess works best when taken once a day every day, on an empty stomach, at least 30 minutes before your first meal of the day.  When Linzess is taken daily as directed:  *Constipation relief is typically felt in about a week *IBS-C patients may begin to experience relief from belly pain and overall abdominal symptoms (pain, discomfort, and bloating) in about 1 week,   with symptoms typically improving over 12 weeks.  Diarrhea may occur in the first 2 weeks -keep taking it.  The diarrhea should go away and you should start having normal, complete, full bowel movements. It may be helpful to start treatment when you can be near the comfort of your own bathroom, such as a weekend.   __________________________________________________________________  We are giving you Hemoccult cards.   When you experience the change of color in your stool, use the cards provided.  Follow the directions on the envelope and return to the lab.  We have sent the following medications to your pharmacy for you to pick up at your convenience: Omeprazole  Thank you for entrusting me with your care and for choosing [facility], Dr. Ileene Patrick

## 2022-11-07 DIAGNOSIS — F419 Anxiety disorder, unspecified: Secondary | ICD-10-CM | POA: Diagnosis not present

## 2022-11-14 DIAGNOSIS — F419 Anxiety disorder, unspecified: Secondary | ICD-10-CM | POA: Diagnosis not present

## 2022-11-23 ENCOUNTER — Telehealth: Payer: Self-pay | Admitting: Gastroenterology

## 2022-11-23 DIAGNOSIS — R109 Unspecified abdominal pain: Secondary | ICD-10-CM

## 2022-11-23 DIAGNOSIS — K219 Gastro-esophageal reflux disease without esophagitis: Secondary | ICD-10-CM

## 2022-11-23 NOTE — Telephone Encounter (Signed)
Inbound call from patient stating she is still having discomfort with indigestion. Patient stated at 7/19 OV a endoscopy was discussed. Patient states she is ready to schedule and would like to discuss scheduling. Please advise, thank you.

## 2022-11-23 NOTE — Telephone Encounter (Signed)
Returned call to patient. Patient reports that her indigestion and upper abdominal pain have been more consistent within the last weeks despite taking Omeprazole daily and IB Gard. Pt states that she is not having any concerns regarding her stools. Pt has been scheduled for EGD in the [facility] on 12/03/22 at 11:30 am. Pt knows that she has to arrive by 10:30 am with a care partner. Pt confirmed access to MyChart and she knows that I will send her instructions there today. Patient verbalized understanding of all information and had no concerns at the end of the call.   Ambulatory referral to GI in epic.  EGD instructions sent to patient via MyChart.

## 2022-11-28 DIAGNOSIS — F419 Anxiety disorder, unspecified: Secondary | ICD-10-CM | POA: Diagnosis not present

## 2022-12-01 ENCOUNTER — Encounter: Payer: Self-pay | Admitting: Certified Registered Nurse Anesthetist

## 2022-12-03 ENCOUNTER — Encounter: Payer: Self-pay | Admitting: Gastroenterology

## 2022-12-03 ENCOUNTER — Ambulatory Visit (AMBULATORY_SURGERY_CENTER): Payer: 59 | Admitting: Gastroenterology

## 2022-12-03 VITALS — BP 118/75 | HR 89 | Temp 98.2°F | Resp 14 | Wt 260.0 lb

## 2022-12-03 DIAGNOSIS — R1013 Epigastric pain: Secondary | ICD-10-CM

## 2022-12-03 DIAGNOSIS — K219 Gastro-esophageal reflux disease without esophagitis: Secondary | ICD-10-CM | POA: Diagnosis not present

## 2022-12-03 DIAGNOSIS — R195 Other fecal abnormalities: Secondary | ICD-10-CM | POA: Diagnosis not present

## 2022-12-03 MED ORDER — SODIUM CHLORIDE 0.9 % IV SOLN: 500.0000 mL | Freq: Once | INTRAVENOUS | Status: DC

## 2022-12-03 NOTE — Op Note (Signed)
[facility] Patient Name: Michelle Simon Procedure Date: 12/03/2022 12:08 PM MRN: [id number] Endoscopist: Viviann Spare P. Adela Lank , MD, [id number] Age: 27 Referring MD:  Date of Birth: Oct 24, 1995 Gender: Female Account #: [id number] Procedure:                Upper GI endoscopy Indications:              Upper abdominal pain, history of gastro-esophageal                            reflux disease - had been on as needed omeprazole.                            Using more routinely for her upper abdominal pain,                            has not worked as well as it has previously.                            history of dark spots in her stool Medicines:                Monitored Anesthesia Care Procedure:                Pre-Anesthesia Assessment:                           - Prior to the procedure, a History and Physical                            was performed, and patient medications and                            allergies were reviewed. The patient's tolerance of                            previous anesthesia was also reviewed. The risks                            and benefits of the procedure and the sedation                            options and risks were discussed with the patient.                            All questions were answered, and informed consent                            was obtained. Prior Anticoagulants: The patient has                            taken no anticoagulant or antiplatelet agents. ASA                            Grade Assessment: III - A patient with severe  systemic disease. After reviewing the risks and                            benefits, the patient was deemed in satisfactory                            condition to undergo the procedure.                           After obtaining informed consent, the endoscope was                            passed under direct vision. Throughout the                            procedure,  the patient's blood pressure, pulse, and                            oxygen saturations were monitored continuously. The                            GIF [id number] #[id number] was introduced through the                            mouth, and advanced to the second part of duodenum.                            The upper GI endoscopy was accomplished without                            difficulty. The patient tolerated the procedure                            well. Scope In: Scope Out: Findings:                 Esophagogastric landmarks were identified: the                            Z-line was found at 37 cm, the gastroesophageal                            junction was found at 37 cm and the upper extent of                            the gastric folds was found at 38 cm from the                            incisors.                           A 1 cm hiatal hernia was present.                           The exam of the esophagus  was otherwise normal.                           The entire examined stomach was normal. Biopsies                            were taken with a cold forceps for Helicobacter                            pylori testing.                           The examined duodenum was normal. Complications:            No immediate complications. Estimated blood loss:                            Minimal. Estimated Blood Loss:     Estimated blood loss was minimal. Impression:               - Esophagogastric landmarks identified.                           - 1 cm hiatal hernia.                           - Normal esophagus otherwise - no erosive changes.                           - Normal stomach. Biopsied.                           - Normal examined duodenum.                           No overt cause for the patient's symptoms on this                            exam. Will await biopsy results. Would increase                            omeprazole for trial of twice daily in the interim                             to see if that helps. Consideration for CT scan if                            symptoms persist Recommendation:           - Patient has a contact number available for                            emergencies. The signs and symptoms of potential                            delayed complications were discussed with the  patient. Return to normal activities tomorrow.                            Written discharge instructions were provided to the                            patient.                           - Resume previous diet.                           - Continue present medications.                           - Increase omeprazole to twice daily for a few week                            trial                           - Await pathology results.                           - Consideration for CT scan if abdominal pains                            persist Isador Castille P. Kirandeep Fariss, MD 12/03/2022 12:37:59 PM This report has been signed electronically.

## 2022-12-03 NOTE — Progress Notes (Signed)
Report given to PACU, vss 

## 2022-12-03 NOTE — Progress Notes (Signed)
Called to room to assist during endoscopic procedure.  Patient ID and intended procedure confirmed with present staff. Received instructions for my participation in the procedure from the performing physician.  

## 2022-12-03 NOTE — Patient Instructions (Signed)
Handout provided about hiatal hernia and GERD.  Resume previous diet.  Continue present medications. INCREASE Omeprazole to twice daily for a few week trial.  Await pathology results.  Consideration for CT scan if abdominal pains persist.  YOU HAD AN ENDOSCOPIC PROCEDURE TODAY AT THE [facility]:   Refer to the procedure report that was given to you for any specific questions about what was found during the examination.  If the procedure report does not answer your questions, please call your gastroenterologist to clarify.  If you requested that your care partner not be given the details of your procedure findings, then the procedure report has been included in a sealed envelope for you to review at your convenience later.  YOU SHOULD EXPECT: Some feelings of bloating in the abdomen. Passage of more gas than usual.  Walking can help get rid of the air that was put into your GI tract during the procedure and reduce the bloating. If you had a lower endoscopy (such as a colonoscopy or flexible sigmoidoscopy) you may notice spotting of blood in your stool or on the toilet paper. If you underwent a bowel prep for your procedure, you may not have a normal bowel movement for a few days.  Please Note:  You might notice some irritation and congestion in your nose or some drainage.  This is from the oxygen used during your procedure.  There is no need for concern and it should clear up in a day or so.  SYMPTOMS TO REPORT IMMEDIATELY:  Following upper endoscopy (EGD)  Vomiting of blood or coffee ground material  New chest pain or pain under the shoulder blades  Painful or persistently difficult swallowing  New shortness of breath  Fever of 100F or higher  Black, tarry-looking stools  For urgent or emergent issues, a gastroenterologist can be reached at any hour by calling [phone]. Do not use MyChart messaging for urgent concerns.    DIET:  We do recommend a small meal at first, but  then you may proceed to your regular diet.  Drink plenty of fluids but you should avoid alcoholic beverages for 24 hours.  ACTIVITY:  You should plan to take it easy for the rest of today and you should NOT DRIVE or use heavy machinery until tomorrow (because of the sedation medicines used during the test).    FOLLOW UP: Our staff will call the number listed on your records the next business day following your procedure.  We will call around 7:15- 8:00 am to check on you and address any questions or concerns that you may have regarding the information given to you following your procedure. If we do not reach you, we will leave a message.     If any biopsies were taken you will be contacted by phone or by letter within the next 1-3 weeks.  Please call us at [phone] if you have not heard about the biopsies in 3 weeks.    SIGNATURES/CONFIDENTIALITY: You and/or your care partner have signed paperwork which will be entered into your electronic medical record.  These signatures attest to the fact that that the information above on your After Visit Summary has been reviewed and is understood.  Full responsibility of the confidentiality of this discharge information lies with you and/or your care-partner.

## 2022-12-03 NOTE — Progress Notes (Signed)
Pt's states no medical or surgical changes since previsit or office visit. 

## 2022-12-03 NOTE — Progress Notes (Signed)
1220 Robinul 0.1 mg IV given due large amount of secretions upon assessment.  MD made aware, vss

## 2022-12-03 NOTE — Progress Notes (Signed)
[facility] History and Physical   Primary Care Physician:  Lequita Asal, MD   Reason for Procedure:   GERD, epigastric pain / dyspepsia, history of dark stool  Plan:    EGD     HPI: Michelle Simon is a 27 y.o. female  here for EGD to evaluate symptoms of reflux, epigastric pain. She has had some dark specs in her stool but was not consistent with melena. On omeprazole 20mg  / day which previously had worked well PRN, but symptoms persisting despite omeprazole daily. Endorses upper abdominal discomfort which can go to her LUQ at times. Hss tried changing her diet which may have helped some but symptoms continue to bother her intermittently..   I have discussed risks / benefits of anesthesia and endoscopic procedure with Judi Saa and they wish to proceed with the exams as outlined today.    Past Medical History:  Diagnosis Date   Anxiety    Chronic constipation    GERD (gastroesophageal reflux disease)    Morbid obesity (HCC) 11/02/2022   46.13   Panic attack     Past Surgical History:  Procedure Laterality Date   NO PAST SURGERIES      Prior to Admission medications   Medication Sig Start Date End Date Taking? Authorizing Provider  amitriptyline (ELAVIL) 50 MG tablet Take 1 tablet (50 mg total) by mouth at bedtime. 07/03/22  Yes   AMITRIPTYLINE HCL PO Take by mouth. TAKES PRN   Yes [provider]  ergocalciferol (VITAMIN D2) 1.25 MG (50000 UT) capsule Take 1 capsule (50,000 Units total) by mouth once a week. 07/17/22  Yes   Etonogestrel (NEXPLANON [place]) Inject into the skin.   Yes [provider]  etonogestrel (NEXPLANON) 68 MG IMPL implant 1 each by Subdermal route once.   Yes [provider]  omeprazole (PRILOSEC) 20 MG capsule Take 1 capsule (20 mg total) by mouth daily. 11/02/22  Yes Taelon Bendorf, Willaim Rayas, MD  Peppermint Oil (IBGARD) 90 MG CPCR Take as directed 05/22/22  Yes Zurri Rudden, Willaim Rayas, MD  polyethylene glycol (MIRALAX)  17 g packet Take 17 g by mouth daily. 11/02/22  Yes Hipolito Martinezlopez, Willaim Rayas, MD  LORAZEPAM PO Take by mouth. TAKES PRN    [provider]    Current Outpatient Medications  Medication Sig Dispense Refill   amitriptyline (ELAVIL) 50 MG tablet Take 1 tablet (50 mg total) by mouth at bedtime. 90 tablet 1   AMITRIPTYLINE HCL PO Take by mouth. TAKES PRN     ergocalciferol (VITAMIN D2) 1.25 MG (50000 UT) capsule Take 1 capsule (50,000 Units total) by mouth once a week. 12 capsule 0   Etonogestrel (NEXPLANON [place]) Inject into the skin.     etonogestrel (NEXPLANON) 68 MG IMPL implant 1 each by Subdermal route once.     omeprazole (PRILOSEC) 20 MG capsule Take 1 capsule (20 mg total) by mouth daily. 90 capsule 3   Peppermint Oil (IBGARD) 90 MG CPCR Take as directed 12 capsule 0   polyethylene glycol (MIRALAX) 17 g packet Take 17 g by mouth daily.     LORAZEPAM PO Take by mouth. TAKES PRN     Current Facility-Administered Medications  Medication Dose Route Frequency Provider Last Rate Last Admin   0.9 %  sodium chloride infusion  500 mL Intravenous Once Adine Heimann, Willaim Rayas, MD        Allergies as of 12/03/2022 - Review Complete 12/03/2022  Allergen Reaction Noted   Augmentin [amoxicillin-pot clavulanate] Nausea And Vomiting  06/14/2015    Family History  Problem Relation Age of Onset   Hypertension Mother    Diabetes Mother    CVA Mother    Heart disease Father    Diabetes Father    Cirrhosis Father    Diabetes Maternal Grandmother    Stomach cancer Neg Hx    Colon cancer Neg Hx    Rectal cancer Neg Hx    Pancreatic cancer Neg Hx    Esophageal cancer Neg Hx     Social History   Socioeconomic History   Marital status: Single    Spouse name: Not on file   Number of children: Not on file   Years of education: Not on file   Highest education level: Not on file  Occupational History   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  Tobacco Use   Smoking  status: Never   Smokeless tobacco: Never  Vaping Use   Vaping status: Never Used  Substance and Sexual Activity   Alcohol use: Not Currently    Comment: rarely   Drug use: Not Currently    Types: Marijuana   Sexual activity: Yes    Birth control/protection: Implant  Other Topics Concern   Not on file  Social History Narrative   Not on file   Social Determinants of Health   Financial Resource Strain: Not on file  Food Insecurity: Not on file  Transportation Needs: Not on file  Physical Activity: Not on file  Stress: Not on file  Social Connections: Not on file  Intimate Partner Violence: Not on file    Review of Systems: All other review of systems negative except as mentioned in the HPI.  Physical Exam: Vital signs BP 115/70   Pulse 93   Temp 98.2 F (36.8 C) (Temporal)   Wt 260 lb (117.9 kg)   LMP 11/13/2022 (Approximate)   SpO2 99%   BMI 46.06 kg/m   General:   Alert,  Well-developed, pleasant and cooperative in NAD Lungs:  Clear throughout to auscultation.   Heart:  Regular rate and rhythm Abdomen:  Soft, nontender and nondistended.   Neuro/Psych:  Alert and cooperative. Normal mood and affect. A and O x 3  Harlin Rain, MD [facility]

## 2022-12-04 ENCOUNTER — Telehealth: Payer: Self-pay

## 2022-12-04 DIAGNOSIS — F419 Anxiety disorder, unspecified: Secondary | ICD-10-CM | POA: Diagnosis not present

## 2022-12-04 NOTE — Telephone Encounter (Signed)
  Follow up Call-     12/03/2022   11:19 AM  Call back number  Post procedure Call Back phone  # [phone]  Permission to leave phone message Yes     Patient questions:  Do you have a fever, pain , or abdominal swelling? No. Pain Score  0 *  Have you tolerated food without any problems? Yes.    Have you been able to return to your normal activities? Yes.    Do you have any questions about your discharge instructions: Diet   No. Medications  No. Follow up visit  No.  Do you have questions or concerns about your Care? No.  Actions: * If pain score is 4 or above: No action needed, pain <4.

## 2022-12-11 DIAGNOSIS — F419 Anxiety disorder, unspecified: Secondary | ICD-10-CM | POA: Diagnosis not present

## 2022-12-17 ENCOUNTER — Ambulatory Visit
Admission: EM | Admit: 2022-12-17 | Discharge: 2022-12-17 | Disposition: A | Discharge status: Home / Self Care | Payer: 59 | Attending: Internal Medicine | Admitting: Internal Medicine

## 2022-12-17 DIAGNOSIS — R1012 Left upper quadrant pain: Secondary | ICD-10-CM

## 2022-12-17 NOTE — Discharge Instructions (Signed)
Declined AVS

## 2022-12-17 NOTE — ED Provider Notes (Signed)
[facility]    CSN: [id number] Arrival date & time: 12/17/22  1621      History   Chief Complaint Chief Complaint  Patient presents with   Abdominal Pain    HPI Michelle Simon is a 27 y.o. female presents for evaluation of chronic abdominal pain.  Patient reports at least 2 months of left upper/epigastric dull aching type pain.  She states it is radiating up into her left breast.  It is worse when she lays flat sometimes she does have some shortness of breath.  She is following with gastroenterology for this and did have an EGD on August 19 showing hiatal hernia with biopsies taken to rule out H. pylori.  She was advised to increase her omeprazole to twice daily.  Patient states she is done this and has no change in her symptoms.  She does not have a follow-up with GI scheduled.  GI note did mention possible CT abdomen if symptoms do not improve with increase in medication.  No other concerns at this time.   Abdominal Pain   Past Medical History:  Diagnosis Date   Anxiety    Chronic constipation    GERD (gastroesophageal reflux disease)    Morbid obesity (HCC) 11/02/2022   46.13   Panic attack     There are no problems to display for this patient.   Past Surgical History:  Procedure Laterality Date   NO PAST SURGERIES      OB History   No obstetric history on file.      Home Medications    Prior to Admission medications   Medication Sig Start Date End Date Taking? Authorizing Provider  amitriptyline (ELAVIL) 50 MG tablet Take 1 tablet (50 mg total) by mouth at bedtime. 07/03/22     AMITRIPTYLINE HCL PO Take by mouth. TAKES PRN    [provider]  ergocalciferol (VITAMIN D2) 1.25 MG (50000 UT) capsule Take 1 capsule (50,000 Units total) by mouth once a week. 07/17/22     Etonogestrel (NEXPLANON [place]) Inject into the skin.    [provider]  etonogestrel (NEXPLANON) 68 MG IMPL implant 1 each by Subdermal route once.    [provider]  LORAZEPAM PO Take by mouth. TAKES PRN    [provider]  omeprazole (PRILOSEC) 20 MG capsule Take 1 capsule (20 mg total) by mouth daily. Patient taking differently: Take 20 mg by mouth 2 (two) times daily before a meal. 11/02/22   Armbruster, Willaim Rayas, MD  Peppermint Oil (IBGARD) 90 MG CPCR Take as directed 05/22/22   Armbruster, Willaim Rayas, MD  polyethylene glycol (MIRALAX) 17 g packet Take 17 g by mouth daily. 11/02/22   Armbruster, Willaim Rayas, MD    Family History Family History  Problem Relation Age of Onset   Hypertension Mother    Diabetes Mother    CVA Mother    Heart disease Father    Diabetes Father    Cirrhosis Father    Diabetes Maternal Grandmother    Stomach cancer Neg Hx    Colon cancer Neg Hx    Rectal cancer Neg Hx    Pancreatic cancer Neg Hx    Esophageal cancer Neg Hx     Social History Social History   Tobacco Use   Smoking status: Never   Smokeless tobacco: Never  Vaping Use   Vaping status: Never Used  Substance Use Topics   Alcohol use: Not Currently    Comment: rarely  Drug use: Not Currently    Types: Marijuana     Allergies   Augmentin [amoxicillin-pot clavulanate]   Review of Systems Review of Systems  Gastrointestinal:  Positive for abdominal pain.     Physical Exam Triage Vital Signs ED Triage Vitals  Encounter Vitals Group     BP 12/17/22 1652 121/85     Systolic BP Percentile --      Diastolic BP Percentile --      Pulse Rate 12/17/22 1652 88     Resp 12/17/22 1652 16     Temp 12/17/22 1652 97.8 F (36.6 C)     Temp Source 12/17/22 1652 Oral     SpO2 12/17/22 1652 98 %     Weight --      Height --      Head Circumference --      Peak Flow --      Pain Score 12/17/22 1656 0     Pain Loc --      Pain Education --      Exclude from Growth Chart --    No data found.  Updated Vital Signs BP 121/85 (BP Location: Right Arm)   Pulse 88   Temp 97.8 F (36.6 C) (Oral)   Resp 16   LMP 11/13/2022 (Approximate)    SpO2 98%   Visual Acuity Right Eye Distance:   Left Eye Distance:   Bilateral Distance:    Right Eye Near:   Left Eye Near:    Bilateral Near:     Physical Exam Vitals and nursing note reviewed.  Constitutional:      General: She is not in acute distress.    Appearance: She is well-developed. She is not ill-appearing.  HENT:     Head: Normocephalic and atraumatic.  Eyes:     Pupils: Pupils are equal, round, and reactive to light.  Cardiovascular:     Rate and Rhythm: Normal rate and regular rhythm.     Heart sounds: Normal heart sounds.  Pulmonary:     Effort: Pulmonary effort is normal.     Breath sounds: Normal breath sounds.  Abdominal:     Tenderness: There is abdominal tenderness in the epigastric area and left upper quadrant. There is no rebound. Negative signs include Rovsing's sign and McBurney's sign.     Comments: Mildly tender epigastric and left upper quadrant.  Skin:    General: Skin is warm and dry.  Neurological:     General: No focal deficit present.     Mental Status: She is alert and oriented to person, place, and time.  Psychiatric:        Mood and Affect: Mood normal.        Behavior: Behavior normal.      UC Treatments / Results  Labs (all labs ordered are listed, but only abnormal results are displayed) Labs Reviewed - No data to display  EKG   Radiology No results found.  Procedures ED EKG  Date/Time: 12/17/2022 5:26 PM  Performed by: Radford Pax, NP Authorized by: Radford Pax, NP   ECG interpreted by ED Physician in the absence of a cardiologist: no   Previous ECG:    Previous ECG:  Unavailable Rate:    ECG rate:  75   ECG rate assessment: normal   Ectopy:    Ectopy: none   QRS:    QRS axis:  Normal ST segments:    ST segments:  Normal T waves:    T  waves: normal   Comments:     [place] rhythm heart rate 75 with sinus arrhythmia with no acute ST-T wave changes or ectopy.  (including critical care time)  Medications  Ordered in UC Medications - No data to display  Initial Impression / Assessment and Plan / UC Course  I have reviewed the triage vital signs and the nursing notes.  Pertinent labs & imaging results that were available during my care of the patient were reviewed by me and considered in my medical decision making (see chart for details).     I reviewed exam and symptoms with patient.  No red flags.  Patient is well-appearing and in no acute distress.  Patient concerned this could be heart related as it is radiating up into her left breast.  EKG performed and was reassuring.  Discussed with patient this is likely related to her chronic abdominal issues and I advised that she continue her omeprazole twice daily and contact her gastroenterologist tomorrow as they may need to order CT as mentioned in their notes.  Patient verbalized understanding this.  Advised to go to the emergency room for any worsening symptoms occur prior to seeing gastroenterology and she verbalized understanding. Final Clinical Impressions(s) / UC Diagnoses   Final diagnoses:  Abdominal pain, left upper quadrant   Discharge Instructions   None    ED Prescriptions   None    PDMP not reviewed this encounter.   Radford Pax, NP 12/17/22 [phone]

## 2022-12-17 NOTE — ED Triage Notes (Signed)
Pt presents to UC w/ c/o left upper abdomen dull ache x2 months. Pt reports sob when laying down and pain radiates to left breast intermittently. Hx gerd Reports some nausea and some constipation

## 2022-12-18 ENCOUNTER — Telehealth: Payer: Self-pay | Admitting: Gastroenterology

## 2022-12-18 DIAGNOSIS — R1013 Epigastric pain: Secondary | ICD-10-CM

## 2022-12-18 DIAGNOSIS — R109 Unspecified abdominal pain: Secondary | ICD-10-CM

## 2022-12-18 DIAGNOSIS — F419 Anxiety disorder, unspecified: Secondary | ICD-10-CM | POA: Diagnosis not present

## 2022-12-18 NOTE — Telephone Encounter (Signed)
Dr. Adela Lank, please see note below? CTAP?

## 2022-12-18 NOTE — Telephone Encounter (Signed)
PT is calling to have an order put in for the CT scan that Dr. Adela Lank advised she has after EGD 12/03/22. Please advise.

## 2022-12-18 NOTE — Telephone Encounter (Signed)
CT order in epic. Secure staff message sent to radiology scheduling to contact patient ASAP to set up CT appt.   MyChart message sent to patient with recommendations.

## 2022-12-18 NOTE — Telephone Encounter (Signed)
Yes, can you help schedule CT abdomen / pelvis for abdominal pain? Thanks

## 2022-12-20 ENCOUNTER — Ambulatory Visit (HOSPITAL_COMMUNITY)
Admission: RE | Admit: 2022-12-20 | Discharge: 2022-12-20 | Disposition: A | Discharge status: Home / Self Care | Payer: 59 | Source: Ambulatory Visit | Attending: Gastroenterology | Admitting: Gastroenterology

## 2022-12-20 DIAGNOSIS — R109 Unspecified abdominal pain: Secondary | ICD-10-CM | POA: Insufficient documentation

## 2022-12-20 DIAGNOSIS — R590 Localized enlarged lymph nodes: Secondary | ICD-10-CM | POA: Diagnosis not present

## 2022-12-20 DIAGNOSIS — R1013 Epigastric pain: Secondary | ICD-10-CM | POA: Insufficient documentation

## 2022-12-20 MED ORDER — IOHEXOL 300 MG/ML  SOLN
100.0000 mL | Freq: Once | INTRAMUSCULAR | Status: AC | PRN
  Administered 2022-12-20: 100 mL via INTRAVENOUS

## 2022-12-24 ENCOUNTER — Other Ambulatory Visit: Payer: Self-pay | Admitting: Gastroenterology

## 2022-12-24 ENCOUNTER — Other Ambulatory Visit: Payer: Self-pay

## 2022-12-24 ENCOUNTER — Other Ambulatory Visit (INDEPENDENT_AMBULATORY_CARE_PROVIDER_SITE_OTHER): Payer: 59

## 2022-12-24 DIAGNOSIS — R933 Abnormal findings on diagnostic imaging of other parts of digestive tract: Secondary | ICD-10-CM

## 2022-12-24 DIAGNOSIS — R590 Localized enlarged lymph nodes: Secondary | ICD-10-CM

## 2022-12-24 DIAGNOSIS — R109 Unspecified abdominal pain: Secondary | ICD-10-CM

## 2022-12-24 LAB — CBC WITH DIFFERENTIAL/PLATELET
Basophils Absolute: 0 10*3/uL (ref 0.0–0.1)
Basophils Relative: 0.7 % (ref 0.0–3.0)
Eosinophils Absolute: 0.1 10*3/uL (ref 0.0–0.7)
Eosinophils Relative: 2.1 % (ref 0.0–5.0)
HCT: 40.6 % (ref 36.0–46.0)
Hemoglobin: 12.9 g/dL (ref 12.0–15.0)
Lymphocytes Relative: 35.7 % (ref 12.0–46.0)
Lymphs Abs: 1.6 10*3/uL (ref 0.7–4.0)
MCHC: 31.8 g/dL (ref 30.0–36.0)
MCV: 76.2 fl — ABNORMAL LOW (ref 78.0–100.0)
Monocytes Absolute: 0.5 10*3/uL (ref 0.1–1.0)
Monocytes Relative: 12.3 % — ABNORMAL HIGH (ref 3.0–12.0)
Neutro Abs: 2.1 10*3/uL (ref 1.4–7.7)
Neutrophils Relative %: 49.2 % (ref 43.0–77.0)
Platelets: 253 10*3/uL (ref 150.0–400.0)
RBC: 5.33 Mil/uL — ABNORMAL HIGH (ref 3.87–5.11)
RDW: 13.2 % (ref 11.5–15.5)
WBC: 4.3 10*3/uL (ref 4.0–10.5)

## 2022-12-24 LAB — COMPREHENSIVE METABOLIC PANEL
ALT: 13 U/L (ref 0–35)
AST: 13 U/L (ref 0–37)
Albumin: 3.7 g/dL (ref 3.5–5.2)
Alkaline Phosphatase: 59 U/L (ref 39–117)
BUN: 9 mg/dL (ref 6–23)
CO2: 27 meq/L (ref 19–32)
Calcium: 8.9 mg/dL (ref 8.4–10.5)
Chloride: 105 meq/L (ref 96–112)
Creatinine, Ser: 0.71 mg/dL (ref 0.40–1.20)
GFR: 117.33 mL/min (ref >60.00)
Glucose, Bld: 95 mg/dL (ref 70–99)
Potassium: 3.9 meq/L (ref 3.5–5.1)
Sodium: 138 meq/L (ref 135–145)
Total Bilirubin: 0.4 mg/dL (ref 0.2–1.2)
Total Protein: 7.7 g/dL (ref 6.0–8.3)

## 2022-12-25 DIAGNOSIS — F419 Anxiety disorder, unspecified: Secondary | ICD-10-CM | POA: Diagnosis not present

## 2022-12-31 ENCOUNTER — Encounter (HOSPITAL_COMMUNITY)
Admission: RE | Admit: 2022-12-31 | Discharge: 2022-12-31 | Disposition: A | Discharge status: Home / Self Care | Payer: 59 | Source: Ambulatory Visit | Attending: Gastroenterology | Admitting: Gastroenterology

## 2022-12-31 DIAGNOSIS — R933 Abnormal findings on diagnostic imaging of other parts of digestive tract: Secondary | ICD-10-CM | POA: Insufficient documentation

## 2022-12-31 DIAGNOSIS — R590 Localized enlarged lymph nodes: Secondary | ICD-10-CM | POA: Insufficient documentation

## 2022-12-31 DIAGNOSIS — R109 Unspecified abdominal pain: Secondary | ICD-10-CM | POA: Insufficient documentation

## 2022-12-31 LAB — GLUCOSE, CAPILLARY: Glucose-Capillary: 99 mg/dL (ref 70–99)

## 2022-12-31 MED ORDER — FLUDEOXYGLUCOSE F - 18 (FDG) INJECTION
13.0000 | Freq: Once | INTRAVENOUS | Status: AC
  Administered 2022-12-31: 12.75 via INTRAVENOUS

## 2023-01-01 ENCOUNTER — Inpatient Hospital Stay: Payer: 59 | Attending: Physician Assistant | Admitting: Physician Assistant

## 2023-01-01 ENCOUNTER — Encounter: Payer: Self-pay | Admitting: Physician Assistant

## 2023-01-01 ENCOUNTER — Telehealth: Payer: Self-pay | Admitting: Oncology

## 2023-01-01 ENCOUNTER — Inpatient Hospital Stay: Payer: 59

## 2023-01-01 ENCOUNTER — Other Ambulatory Visit: Payer: Self-pay

## 2023-01-01 ENCOUNTER — Other Ambulatory Visit: Payer: Self-pay | Admitting: Physician Assistant

## 2023-01-01 VITALS — BP 126/80 | HR 80 | Temp 97.5°F | Resp 20 | Wt 262.0 lb

## 2023-01-01 DIAGNOSIS — R110 Nausea: Secondary | ICD-10-CM | POA: Diagnosis not present

## 2023-01-01 DIAGNOSIS — F419 Anxiety disorder, unspecified: Secondary | ICD-10-CM | POA: Diagnosis not present

## 2023-01-01 DIAGNOSIS — Z8249 Family history of ischemic heart disease and other diseases of the circulatory system: Secondary | ICD-10-CM | POA: Insufficient documentation

## 2023-01-01 DIAGNOSIS — Z880 Allergy status to penicillin: Secondary | ICD-10-CM | POA: Diagnosis not present

## 2023-01-01 DIAGNOSIS — Z833 Family history of diabetes mellitus: Secondary | ICD-10-CM | POA: Insufficient documentation

## 2023-01-01 DIAGNOSIS — R590 Localized enlarged lymph nodes: Secondary | ICD-10-CM | POA: Insufficient documentation

## 2023-01-01 DIAGNOSIS — Z823 Family history of stroke: Secondary | ICD-10-CM | POA: Insufficient documentation

## 2023-01-01 DIAGNOSIS — Z8379 Family history of other diseases of the digestive system: Secondary | ICD-10-CM | POA: Insufficient documentation

## 2023-01-01 DIAGNOSIS — R933 Abnormal findings on diagnostic imaging of other parts of digestive tract: Secondary | ICD-10-CM

## 2023-01-01 DIAGNOSIS — K219 Gastro-esophageal reflux disease without esophagitis: Secondary | ICD-10-CM | POA: Diagnosis not present

## 2023-01-01 DIAGNOSIS — Z79899 Other long term (current) drug therapy: Secondary | ICD-10-CM | POA: Insufficient documentation

## 2023-01-01 DIAGNOSIS — Z881 Allergy status to other antibiotic agents status: Secondary | ICD-10-CM | POA: Insufficient documentation

## 2023-01-01 LAB — CBC WITH DIFFERENTIAL (CANCER CENTER ONLY)
Abs Immature Granulocytes: 0.01 10*3/uL (ref 0.00–0.07)
Basophils Absolute: 0 10*3/uL (ref 0.0–0.1)
Basophils Relative: 1 %
Eosinophils Absolute: 0.1 10*3/uL (ref 0.0–0.5)
Eosinophils Relative: 2 %
HCT: 39.3 % (ref 36.0–46.0)
Hemoglobin: 12.9 g/dL (ref 12.0–15.0)
Immature Granulocytes: 0 %
Lymphocytes Relative: 37 %
Lymphs Abs: 1.5 10*3/uL (ref 0.7–4.0)
MCH: 24.8 pg — ABNORMAL LOW (ref 26.0–34.0)
MCHC: 32.8 g/dL (ref 30.0–36.0)
MCV: 75.6 fL — ABNORMAL LOW (ref 80.0–100.0)
Monocytes Absolute: 0.5 10*3/uL (ref 0.1–1.0)
Monocytes Relative: 11 %
Neutro Abs: 2 10*3/uL (ref 1.7–7.7)
Neutrophils Relative %: 49 %
Platelet Count: 262 10*3/uL (ref 150–400)
RBC: 5.2 MIL/uL — ABNORMAL HIGH (ref 3.87–5.11)
RDW: 12.9 % (ref 11.5–15.5)
WBC Count: 4.2 10*3/uL (ref 4.0–10.5)
nRBC: 0 % (ref 0.0–0.2)

## 2023-01-01 LAB — LACTATE DEHYDROGENASE: LDH: 97 U/L — ABNORMAL LOW (ref 98–192)

## 2023-01-01 LAB — CMP (CANCER CENTER ONLY)
ALT: 13 U/L (ref 0–44)
AST: 14 U/L — ABNORMAL LOW (ref 15–41)
Albumin: 3.9 g/dL (ref 3.5–5.0)
Alkaline Phosphatase: 65 U/L (ref 38–126)
Anion gap: 6 (ref 5–15)
BUN: 11 mg/dL (ref 6–20)
CO2: 26 mmol/L (ref 22–32)
Calcium: 9 mg/dL (ref 8.9–10.3)
Chloride: 106 mmol/L (ref 98–111)
Creatinine: 0.79 mg/dL (ref 0.44–1.00)
GFR, Estimated: >60 mL/min (ref >60)
Glucose, Bld: 102 mg/dL — ABNORMAL HIGH (ref 70–99)
Potassium: 3.6 mmol/L (ref 3.5–5.1)
Sodium: 138 mmol/L (ref 135–145)
Total Bilirubin: 0.3 mg/dL (ref 0.3–1.2)
Total Protein: 7.7 g/dL (ref 6.5–8.1)

## 2023-01-01 LAB — C-REACTIVE PROTEIN: CRP: 0.7 mg/dL (ref <1.0)

## 2023-01-01 LAB — SEDIMENTATION RATE: Sed Rate: 26 mm/h — ABNORMAL HIGH (ref 0–22)

## 2023-01-01 NOTE — Progress Notes (Unsigned)
Rapid Diagnostic Clinic [facility] [phone]) [phone]   [phone]  INITIAL CONSULTATION:  Patient Care Team: Lequita Asal, MD as PCP - General (Family Medicine)  CHIEF COMPLAINTS/PURPOSE OF CONSULTATION:  Mesenteric lymphadenopathy  HISTORY OF PRESENTING ILLNESS:  Michelle Simon 27 y.o. female with medical history significant for GERD, anxiety and constipation presents to the diagnostic clinic for evaluation of abnormal CT imaging concerning for mesenteric adenopathy. She is unaccompanied for this visit.   On review of the previous records, Michelle Simon underwent CT imaging ordered by her gastroenterologist on 12/20/2022 due to abdominal discomfort and nausea present for several months. Findings showed diffuse mesenteric lymphadenopathy, suspicious lymphoproliferative disorder. A PET/CT scan followed on 12/31/2022 which showed prominent mesenteric lymphadenopathy slightly improved and demonstrates no definitive hypermetabolism.   On exam today, Michelle Simon reports that the upper epigastric pain she felt in the past is actually improving. She is otherwise feeling well without any concerning symptoms. Her energy and appetite are overall stable. She denies any unintentional weight loss. She  reports having occasional episodes of dark specs in her stool. She denies easy bruising or signs of active bleeding. She denies fevers, chills, sweats, shortness of breath, chest pain or cough. She has no other complaints. Rest of the ROS is below.   MEDICAL HISTORY:  Past Medical History:  Diagnosis Date   Anxiety    Chronic constipation    GERD (gastroesophageal reflux disease)    Morbid obesity (HCC) 11/02/2022   46.13   Panic attack     SURGICAL HISTORY: Past Surgical History:  Procedure Laterality Date   NO PAST SURGERIES      SOCIAL HISTORY: Social History   Socioeconomic History   Marital status: Single    Spouse name: Not on file   Number of  children: Not on file   Years of education: Not on file   Highest education level: Not on file  Occupational History   Occupation: Social worker    Comment: [employer] [facility]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  Tobacco Use   Smoking status: Never   Smokeless tobacco: Never  Vaping Use   Vaping status: Never Used  Substance and Sexual Activity   Alcohol use: Not Currently    Comment: rarely   Drug use: Not Currently    Types: Marijuana   Sexual activity: Yes    Birth control/protection: Implant  Other Topics Concern   Not on file  Social History Narrative   Not on file   Social Determinants of Health   Financial Resource Strain: Not on file  Food Insecurity: No Food Insecurity (01/01/2023)   Hunger Vital Sign    Worried About Running Out of Food in the Last Year: Never true    Ran Out of Food in the Last Year: Never true  Transportation Needs: No Transportation Needs (01/01/2023)   PRAPARE - Administrator, Civil Service (Medical): No    Lack of Transportation (Non-Medical): No  Physical Activity: Not on file  Stress: Not on file  Social Connections: Not on file  Intimate Partner Violence: Not At Risk (01/01/2023)   Humiliation, Afraid, Rape, and Kick questionnaire    Fear of Current or Ex-Partner: No    Emotionally Abused: No    Physically Abused: No    Sexually Abused: No    FAMILY HISTORY: Family History  Problem Relation Age of Onset   Hypertension Mother    Diabetes Mother    CVA Mother    Heart disease Father  Diabetes Father    Cirrhosis Father    Diabetes Maternal Grandmother    Stomach cancer Neg Hx    Colon cancer Neg Hx    Rectal cancer Neg Hx    Pancreatic cancer Neg Hx    Esophageal cancer Neg Hx     ALLERGIES:  is allergic to augmentin [amoxicillin-pot clavulanate].  MEDICATIONS:  Current Outpatient Medications  Medication Sig Dispense Refill   amitriptyline (ELAVIL) 50 MG tablet Take 1 tablet (50 mg total) by mouth at bedtime. 90 tablet 1    ergocalciferol (VITAMIN D2) 1.25 MG (50000 UT) capsule Take 1 capsule (50,000 Units total) by mouth once a week. 12 capsule 0   etonogestrel (NEXPLANON) 68 MG IMPL implant 1 each by Subdermal route once.     LORAZEPAM PO Take by mouth. TAKES PRN     omeprazole (PRILOSEC) 20 MG capsule Take 1 capsule (20 mg total) by mouth daily. (Patient taking differently: Take 20 mg by mouth 2 (two) times daily before a meal.) 90 capsule 3   AMITRIPTYLINE HCL PO Take by mouth. TAKES PRN     Etonogestrel (NEXPLANON Miranda) Inject into the skin. (Patient not taking: Reported on 01/01/2023)     Peppermint Oil (IBGARD) 90 MG CPCR Take as directed (Patient not taking: Reported on 01/01/2023) 12 capsule 0   polyethylene glycol (MIRALAX) 17 g packet Take 17 g by mouth daily. (Patient not taking: Reported on 01/01/2023)     No current facility-administered medications for this visit.    REVIEW OF SYSTEMS:   Constitutional: ( - ) fevers, ( - )  chills , ( - ) night sweats Eyes: ( - ) blurriness of vision, ( - ) double vision, ( - ) watery eyes Ears, nose, mouth, throat, and face: ( - ) mucositis, ( - ) sore throat Respiratory: ( - ) cough, ( - ) dyspnea, ( - ) wheezes Cardiovascular: ( - ) palpitation, ( - ) chest discomfort, ( - ) lower extremity swelling Gastrointestinal:  ( - ) nausea, ( - ) heartburn, ( - ) change in bowel habits Skin: ( - ) abnormal skin rashes Lymphatics: ( - ) new lymphadenopathy, ( - ) easy bruising Neurological: ( - ) numbness, ( - ) tingling, ( - ) new weaknesses Behavioral/Psych: ( - ) mood change, ( - ) new changes  All other systems were reviewed with the patient and are negative.  PHYSICAL EXAMINATION: ECOG PERFORMANCE STATUS: 1 - Symptomatic but completely ambulatory  Vitals:   01/01/23 1243  BP: 126/80  Pulse: 80  Resp: 20  Temp: (!) 97.5 F (36.4 C)  SpO2: 100%   Filed Weights   01/01/23 1243  Weight: 262 lb (118.8 kg)    GENERAL: well appearing female in NAD  SKIN: skin  color, texture, turgor are normal, no rashes or significant lesions EYES: conjunctiva are pink and non-injected, sclera clear OROPHARYNX: no exudate, no erythema; lips, buccal mucosa, and tongue normal  NECK: supple, non-tender LYMPH:  no palpable lymphadenopathy in the cervical, axillary or supraclavicular lymph nodes.  LUNGS: clear to auscultation and percussion with normal breathing effort HEART: regular rate & rhythm and no murmurs and no lower extremity edema ABDOMEN: soft, non-tender, non-distended, normal bowel sounds Musculoskeletal: no cyanosis of digits and no clubbing  PSYCH: alert & oriented x 3, fluent speech NEURO: no focal motor/sensory deficits  LABORATORY DATA:  I have reviewed the data as listed    Latest Ref Rng & Units 12/24/2022   10:43  AM 05/22/2022    3:12 PM 08/21/2017    9:30 PM  CBC  WBC 4.0 - 10.5 K/uL 4.3  3.8  4.9   Hemoglobin 12.0 - 15.0 g/dL 82.9  56.2  13.0   Hematocrit 36.0 - 46.0 % 40.6  39.7  39.1   Platelets 150.0 - 400.0 K/uL 253.0  261.0  252        Latest Ref Rng & Units 12/24/2022   10:43 AM 05/22/2022    3:12 PM 08/21/2017    9:30 PM  CMP  Glucose 70 - 99 mg/dL 95  86  865   BUN 6 - 23 mg/dL 9  9  <5   Creatinine 7.84 - 1.20 mg/dL 6.96  2.95  2.84   Sodium 135 - 145 mEq/L 138  138  137   Potassium 3.5 - 5.1 mEq/L 3.9  3.7  3.6   Chloride 96 - 112 mEq/L 105  104  105   CO2 19 - 32 mEq/L 27  23  25    Calcium 8.4 - 10.5 mg/dL 8.9  8.9  8.9   Total Protein 6.0 - 8.3 g/dL 7.7  8.0    Total Bilirubin 0.2 - 1.2 mg/dL 0.4  0.4    Alkaline Phos 39 - 117 U/L 59  55    AST 0 - 37 U/L 13  10    ALT 0 - 35 U/L 13  8       RADIOGRAPHIC STUDIES: I have personally reviewed the radiological images as listed and agreed with the findings in the report. NM PET Image Initial (PI) Skull Base To Thigh (F-18 FDG)  Result Date: 12/31/2022 CLINICAL DATA:  Initial treatment strategy for mesenteric lymphadenopathy. EXAM: NUCLEAR MEDICINE PET SKULL BASE TO THIGH  TECHNIQUE: 12.8 mCi F-18 FDG was injected intravenously. Full-ring PET imaging was performed from the skull base to thigh after the radiotracer. CT data was obtained and used for attenuation correction and anatomic localization. Fasting blood glucose: 99 mg/dl COMPARISON:  CT of the abdomen and pelvis 12/20/2022. FINDINGS: Mediastinal blood pool activity: SUV max 2.9 Liver activity: SUV max 4.7 NECK: No hypermetabolic lymph nodes in the neck. Incidental CT findings: None. CHEST: No hypermetabolic mediastinal or hilar nodes. No suspicious pulmonary nodules on the CT scan. Incidental CT findings: None. ABDOMEN/PELVIS: Multiple prominent borderline enlarged mesenteric lymph nodes are again noted, overall similar to decreased in size compared to the prior CT examination, demonstrating no definite hypermetabolism on the PET portion of today's examination. No abnormal hypermetabolic activity within the liver, pancreas, adrenal glands, or spleen. Incidental CT findings: None. SKELETON: No focal hypermetabolic activity to suggest skeletal metastasis. Incidental CT findings: None. IMPRESSION: 1. Prominent mesenteric lymphadenopathy appears slightly improved compared to the prior examination and demonstrates no definite hypermetabolism on today's PET study. This is nonspecific, but lack of hypermetabolism suggests against malignancy. Electronically Signed   By: Trudie Reed M.D.   On: 12/31/2022 08:42   CT ABDOMEN PELVIS W CONTRAST  Result Date: 12/23/2022 CLINICAL DATA:  Abdominal pain and epigastric pain for several months. Nausea. EXAM: CT ABDOMEN AND PELVIS WITH CONTRAST TECHNIQUE: Multidetector CT imaging of the abdomen and pelvis was performed using the standard protocol following bolus administration of intravenous contrast. RADIATION DOSE REDUCTION: This exam was performed according to the departmental dose-optimization program which includes automated exposure control, adjustment of the mA and/or kV according  to patient size and/or use of iterative reconstruction technique. CONTRAST:  OMNIPAQUE IOHEXOL 300 MG/ML  SOLN COMPARISON:  None Available. FINDINGS: Lower Chest: No acute findings. Hepatobiliary: No suspicious hepatic masses identified. Gallbladder is unremarkable. No evidence of biliary ductal dilatation. Pancreas:  No mass or inflammatory changes. Spleen: Within normal limits in size and appearance. Adrenals/Urinary Tract: No suspicious masses identified. No evidence of ureteral calculi or hydronephrosis. Stomach/Bowel: No evidence of obstruction, inflammatory process or abnormal fluid collections. Vascular/Lymphatic: Diffuse mesenteric lymphadenopathy is seen with innumerable small lymph nodes measuring up to 1 cm. No other sites of lymphadenopathy identified. No acute vascular findings. Reproductive:  No mass or other significant abnormality. Other:  None. Musculoskeletal:  No suspicious bone lesions identified. IMPRESSION: Diffuse mesenteric lymphadenopathy, suspicious for lymphoproliferative disorder. Recommend correlation with laboratory evaluation, and consider PET-CT scan for further imaging evaluation. Electronically Signed   By: Danae Orleans M.D.   On: 12/23/2022 11:00    ASSESSMENT & PLAN Michelle Simon is a 27 y.o. female who presents to the diagnostic clinic for evaluation of abnormal CT imaging concerning for mesenteric lymphadenopathy. We reviewed possible etologies including infectious process, inflammatory process or underlying malignancy. Fortunately, PET/CT scan does show decrease in size of borderline enlarged mesenteric lymph nodes without any FDG avidity. Recommend laboratory evaluation today to evaluate possibe etology. In the meantime, we will reach out to radiology group to confirm lymph nodes are improving without any concerning features. If workup is negative, recommend to monitor with repeat CT imaging in 3 months.   #Mesenteric lymphadenopathy: --Seen on CT abdomen/pelvis  from 12/20/2022. --PET/CT scan from 12/31/2022 showed borderline enlarged mesenteric lymph nodes are again noted, overall similar to decreased in size compared to the prior CT examination, demonstrating no definite hypermetabolism --Labs today to check CBC, CMP, LDH, flow cytometry, ESR, CRP, chromogranin levels --Consult with radiology group to confirm improvement of lymph nodes without any suspicious features.  --No B symptoms including fevers, weight loss or night sweats. --Tentative plan to monitor with repeat CT imaging in 3 months unless above workup requires further intervention (biopsy)  No orders of the defined types were placed in this encounter.   All questions were answered. The patient knows to call the clinic with any problems, questions or concerns.  I have spent a total of 60 minutes minutes of face-to-face and non-face-to-face time, preparing to see the patient, obtaining and/or reviewing separately obtained history, performing a medically appropriate examination, counseling and educating the patient, ordering tests/procedures, referring and communicating with other health care professionals, documenting clinical information in the electronic health record, independently interpreting results and communicating results to the patient, and care coordination.   Michelle Kaufmann, PA-C Department of Hematology/Oncology [facility] at [facility] Phone: [phone]  Patient was seen with Dr. Leonides Schanz  I have read the above note and personally examined the patient. I agree with the assessment and plan as noted above.  Briefly Michelle Simon is a 27 year old female who presents for evaluation of mesenteric and abdominal lymphadenopathy.  Was originally noted on CT scan of the abdomen performed on 12/20/2022 due to abdominal discomfort.  She was noted to have diffuse mesenteric lymphadenopathy with mostly lymph nodes measuring 1 cm or last.  16 2024 which showed  prominent mesenteric lymphadenopathy.  Slightly improved as compared to prior.  There was no definitive hypermetabolism with the finding suggesting against malignancy.  Due to concern for these findings the patient was referred to hematology for further evaluation and management.  At this time given the relatively low FDG avidity and small size of these lymph nodes would recommend  observation.  We can have the patient return to clinic in 3 months time with repeat imaging in order to assure that the lymph nodes have not increased in size and have returned to normal.  The patient voiced understanding of our findings and plan moving forward.   Ulysees Barns, MD Department of Hematology/Oncology [facility] at [facility] Phone: [phone] Pager: [phone] Email: [email or web address]

## 2023-01-01 NOTE — Telephone Encounter (Signed)
[facility]  Diagnostic Nurse Navigator Telephone Note:  Initial Encounter  @today @  Patient Name:  Michelle Simon Patient MRN:  [id number] Patient DOB:  09-11-1995   I contacted Michelle Simon regarding their hematology/oncology referral from Dr. Adela Lank for purpose of evaluation and work up of mesenteric lymphadenopathy.  I introduced myself by phone and confirmed the patient's identity using two identifiers; introduced myself, my role, and reason for contact.  Michelle Simon was aware of her referral and reason.  Information on reason for referral was not necessary.  The [facility]) was explained to the patient, including the intent being to complete a diagnostic/prognostic plan of care for the patient's findings concerning for cancer after a face-to-face visit and physical assessment in clinic by the [facility] APP and/or the supervising physician.  Pertinent questions were addressed to the patient's satisfaction at this time.    The patient is aware of [facility] appointment details and of the intent of the visit(s).    We look forward to meeting Michelle Simon to complete her diagnostic work-up and coordination of subsequent care post-diagnosis as appropriate.  Rapid Diagnostic Clinic Care Team Info:  Best contact/way to contact patient:  Mobile CHL updated to reflect?:  not applicable Is there a HC POA?:  No [facility] Transportation Screen completed:  No Electronic Welcome Letter reviewed and sent:  No Age Related Cancer Screenings:  None seen (Pap)  Rapid [facility] Clinic Appointment Details Provided at Time of Call: Appt Provider:  Georga Kaufmann, PA Appt Date:  01/01/2023 Appt Time:  1230 Comment:   Referral Investigation: Brief HPI:  Seen in ED on 12/17/2022 with complaint of abdominal pain, scans and labs not ordered; however, referral back to GI was placed.  Patient completed her GI follow-up and CT ABD PELVIS  and PET were ordered and subsequent reasons for referral.  EGD performed 12/03/2022 and biopsies negative.    Supportive Diagnostics:   Date:  12/20/2022 Diagnostic Performed:  CT ABDOMEN PELVIS W CONTRAST Impression:   Diffuse mesenteric lymphadenopathy, suspicious for lymphoproliferative disorder. Recommend correlation with laboratory evaluation, and consider PET-CT scan for further imaging evaluation.  Date:  12/31/2022  Diagnostic Performed:  NM PET IMAGE INITIAL SKULL TO THIGH Impression:   1. Prominent mesenteric lymphadenopathy appears slightly improved compared to the prior examination and demonstrates no definite hypermetabolism on today's PET study. This is nonspecific, but lack of hypermetabolism suggests against malignancy.  Reviewer:  Payton Doughty, RN                   [facility]

## 2023-01-03 ENCOUNTER — Telehealth: Payer: Self-pay | Admitting: Physician Assistant

## 2023-01-03 DIAGNOSIS — R590 Localized enlarged lymph nodes: Secondary | ICD-10-CM

## 2023-01-03 NOTE — Telephone Encounter (Signed)
I called Ms. Michelle Simon to review the lab results from 01/01/2023. Findings showed elevated chromogranin levels which can suggest a possible neuroendocrine tumor. Dr. Leonides Schanz recommends a PET DOTATATE to further evaluate. I did explain that other risk factors (PPIs, inflammatory process, smoking, etc) can cause an elevated chromogranin levels but we need to rule out neuroendocrine tumors first. Ms. Michelle Simon expressed understanding of the plan provided.

## 2023-01-07 LAB — FLOW CYTOMETRY

## 2023-01-07 LAB — SURGICAL PATHOLOGY

## 2023-01-08 DIAGNOSIS — F419 Anxiety disorder, unspecified: Secondary | ICD-10-CM | POA: Diagnosis not present

## 2023-01-09 DIAGNOSIS — Z130 Encounter for screening for diseases of the blood and blood-forming organs and certain disorders involving the immune mechanism: Secondary | ICD-10-CM | POA: Diagnosis not present

## 2023-01-09 DIAGNOSIS — Z113 Encounter for screening for infections with a predominantly sexual mode of transmission: Secondary | ICD-10-CM | POA: Diagnosis not present

## 2023-01-09 DIAGNOSIS — Z1389 Encounter for screening for other disorder: Secondary | ICD-10-CM | POA: Diagnosis not present

## 2023-01-09 DIAGNOSIS — Z124 Encounter for screening for malignant neoplasm of cervix: Secondary | ICD-10-CM | POA: Diagnosis not present

## 2023-01-09 DIAGNOSIS — R87810 Cervical high risk human papillomavirus (HPV) DNA test positive: Secondary | ICD-10-CM | POA: Diagnosis not present

## 2023-01-09 DIAGNOSIS — Z3046 Encounter for surveillance of implantable subdermal contraceptive: Secondary | ICD-10-CM | POA: Diagnosis not present

## 2023-01-09 DIAGNOSIS — K449 Diaphragmatic hernia without obstruction or gangrene: Secondary | ICD-10-CM | POA: Diagnosis not present

## 2023-01-09 DIAGNOSIS — Z01419 Encounter for gynecological examination (general) (routine) without abnormal findings: Secondary | ICD-10-CM | POA: Diagnosis not present

## 2023-01-09 DIAGNOSIS — K219 Gastro-esophageal reflux disease without esophagitis: Secondary | ICD-10-CM | POA: Diagnosis not present

## 2023-01-09 DIAGNOSIS — R87612 Low grade squamous intraepithelial lesion on cytologic smear of cervix (LGSIL): Secondary | ICD-10-CM | POA: Diagnosis not present

## 2023-01-11 ENCOUNTER — Encounter (HOSPITAL_COMMUNITY)
Admission: RE | Admit: 2023-01-11 | Discharge: 2023-01-11 | Disposition: A | Discharge status: Home / Self Care | Payer: 59 | Source: Ambulatory Visit | Attending: Physician Assistant | Admitting: Physician Assistant

## 2023-01-11 DIAGNOSIS — R590 Localized enlarged lymph nodes: Secondary | ICD-10-CM | POA: Insufficient documentation

## 2023-01-11 MED ORDER — COPPER CU 64 DOTATATE 1 MCI/ML IV SOLN
4.0000 | Freq: Once | INTRAVENOUS | Status: AC
  Administered 2023-01-11: 4 via INTRAVENOUS

## 2023-01-14 ENCOUNTER — Telehealth: Payer: Self-pay | Admitting: Physician Assistant

## 2023-01-14 ENCOUNTER — Encounter: Payer: Self-pay | Admitting: Gastroenterology

## 2023-01-14 DIAGNOSIS — R590 Localized enlarged lymph nodes: Secondary | ICD-10-CM

## 2023-01-14 NOTE — Telephone Encounter (Signed)
I called Ms. Michelle Simon to review the PET DOTATATE results from 01/11/2023. Findings showed no evidence of PET avid disease to suggest a neuroendocrine tumor. The small, prominent mesenteric lymph nodes without somatostatin activity or metabolic activity, favoring a benign adenopathy. Dr. Leonides Schanz does not recommend any further workup at this time. We will see patient back in 6 months with repeat CT abdomen/pelvis to monitor the mesenteric lymph node.  Michelle Simon expressed understanding of the plan provided.

## 2023-01-14 NOTE — Telephone Encounter (Signed)
Great, thanks very much for your help with her case.

## 2023-01-15 DIAGNOSIS — F419 Anxiety disorder, unspecified: Secondary | ICD-10-CM | POA: Diagnosis not present

## 2023-01-16 ENCOUNTER — Encounter: Payer: Self-pay | Admitting: Gastroenterology

## 2023-01-22 DIAGNOSIS — F419 Anxiety disorder, unspecified: Secondary | ICD-10-CM | POA: Diagnosis not present

## 2023-01-24 DIAGNOSIS — N870 Mild cervical dysplasia: Secondary | ICD-10-CM | POA: Diagnosis not present

## 2023-01-24 DIAGNOSIS — N72 Inflammatory disease of cervix uteri: Secondary | ICD-10-CM | POA: Diagnosis not present

## 2023-01-24 DIAGNOSIS — Z23 Encounter for immunization: Secondary | ICD-10-CM | POA: Diagnosis not present

## 2023-01-24 DIAGNOSIS — R87612 Low grade squamous intraepithelial lesion on cytologic smear of cervix (LGSIL): Secondary | ICD-10-CM | POA: Diagnosis not present

## 2023-01-25 DIAGNOSIS — R079 Chest pain, unspecified: Secondary | ICD-10-CM | POA: Diagnosis not present

## 2023-01-25 DIAGNOSIS — Z131 Encounter for screening for diabetes mellitus: Secondary | ICD-10-CM | POA: Diagnosis not present

## 2023-01-25 DIAGNOSIS — Z0000 Encounter for general adult medical examination without abnormal findings: Secondary | ICD-10-CM | POA: Diagnosis not present

## 2023-01-25 DIAGNOSIS — E559 Vitamin D deficiency, unspecified: Secondary | ICD-10-CM | POA: Diagnosis not present

## 2023-02-05 DIAGNOSIS — F419 Anxiety disorder, unspecified: Secondary | ICD-10-CM | POA: Diagnosis not present

## 2023-02-14 DIAGNOSIS — F419 Anxiety disorder, unspecified: Secondary | ICD-10-CM | POA: Diagnosis not present

## 2023-02-19 DIAGNOSIS — F419 Anxiety disorder, unspecified: Secondary | ICD-10-CM | POA: Diagnosis not present

## 2023-02-24 ENCOUNTER — Encounter (HOSPITAL_COMMUNITY): Payer: Self-pay | Admitting: Emergency Medicine

## 2023-02-24 ENCOUNTER — Other Ambulatory Visit: Payer: Self-pay

## 2023-02-24 ENCOUNTER — Emergency Department (HOSPITAL_COMMUNITY): Payer: 59

## 2023-02-24 ENCOUNTER — Emergency Department (HOSPITAL_COMMUNITY)
Admission: EM | Admit: 2023-02-24 | Discharge: 2023-02-24 | Disposition: A | Discharge status: Home / Self Care | Payer: 59 | Attending: Emergency Medicine | Admitting: Emergency Medicine

## 2023-02-24 DIAGNOSIS — R072 Precordial pain: Secondary | ICD-10-CM | POA: Diagnosis not present

## 2023-02-24 DIAGNOSIS — R079 Chest pain, unspecified: Secondary | ICD-10-CM | POA: Diagnosis not present

## 2023-02-24 DIAGNOSIS — F419 Anxiety disorder, unspecified: Secondary | ICD-10-CM | POA: Insufficient documentation

## 2023-02-24 DIAGNOSIS — R0602 Shortness of breath: Secondary | ICD-10-CM | POA: Insufficient documentation

## 2023-02-24 DIAGNOSIS — R0789 Other chest pain: Secondary | ICD-10-CM

## 2023-02-24 LAB — BASIC METABOLIC PANEL
Anion gap: 9 (ref 5–15)
BUN: 8 mg/dL (ref 6–20)
CO2: 24 mmol/L (ref 22–32)
Calcium: 8.9 mg/dL (ref 8.9–10.3)
Chloride: 103 mmol/L (ref 98–111)
Creatinine, Ser: 0.69 mg/dL (ref 0.44–1.00)
GFR, Estimated: >60 mL/min (ref >60)
Glucose, Bld: 98 mg/dL (ref 70–99)
Potassium: 3.4 mmol/L — ABNORMAL LOW (ref 3.5–5.1)
Sodium: 136 mmol/L (ref 135–145)

## 2023-02-24 LAB — CBC
HCT: 41.9 % (ref 36.0–46.0)
Hemoglobin: 13.4 g/dL (ref 12.0–15.0)
MCH: 24.7 pg — ABNORMAL LOW (ref 26.0–34.0)
MCHC: 32 g/dL (ref 30.0–36.0)
MCV: 77.3 fL — ABNORMAL LOW (ref 80.0–100.0)
Platelets: 264 10*3/uL (ref 150–400)
RBC: 5.42 MIL/uL — ABNORMAL HIGH (ref 3.87–5.11)
RDW: 13.2 % (ref 11.5–15.5)
WBC: 4.9 10*3/uL (ref 4.0–10.5)
nRBC: 0 % (ref 0.0–0.2)

## 2023-02-24 LAB — TROPONIN I (HIGH SENSITIVITY)
Troponin I (High Sensitivity): <2 ng/L (ref <18)
Troponin I (High Sensitivity): <2 ng/L (ref <18)

## 2023-02-24 MED ORDER — ALBUTEROL SULFATE HFA 108 (90 BASE) MCG/ACT IN AERS: 1.0000 | INHALATION_SPRAY | Freq: Four times a day (QID) | RESPIRATORY_TRACT | 0 refills | Status: AC | PRN

## 2023-02-24 NOTE — Discharge Instructions (Signed)
Follow-up with your doctor in the next couple weeks for recheck

## 2023-02-24 NOTE — ED Provider Notes (Signed)
[place] EMERGENCY DEPARTMENT AT [facility] Provider Note   CSN: [id number] Arrival date & time: 02/24/23  1553     History {Add pertinent medical, surgical, social history, OB history to HPI:1} Chief Complaint  Patient presents with   Chest Pain    Michelle Simon is a 27 y.o. female.  Patient complains of chest pain and shortness of breath.  She has had a GI workup which was basically unremarkable but they have kept her on Prilosec.   Chest Pain      Home Medications Prior to Admission medications   Medication Sig Start Date End Date Taking? Authorizing Provider  albuterol (VENTOLIN HFA) 108 (90 Base) MCG/ACT inhaler Inhale 1-2 puffs into the lungs every 6 (six) hours as needed for wheezing or shortness of breath. 02/24/23  Yes Bethann Berkshire, MD  amitriptyline (ELAVIL) 50 MG tablet Take 1 tablet (50 mg total) by mouth at bedtime. 07/03/22  Yes   ergocalciferol (VITAMIN D2) 1.25 MG (50000 UT) capsule Take 1 capsule (50,000 Units total) by mouth once a week. 07/17/22  Yes   LORazepam (ATIVAN) 1 MG tablet Take 1 mg by mouth daily as needed.   Yes [provider]  omeprazole (PRILOSEC) 20 MG capsule Take 1 capsule (20 mg total) by mouth daily. 11/02/22  Yes Armbruster, Willaim Rayas, MD  polyethylene glycol (MIRALAX) 17 g packet Take 17 g by mouth daily. Patient taking differently: Take 17 g by mouth daily as needed for mild constipation or moderate constipation. 11/02/22  Yes Armbruster, Willaim Rayas, MD  etonogestrel (NEXPLANON) 68 MG IMPL implant 1 each by Subdermal route once.    [provider]      Allergies    Augmentin [amoxicillin-pot clavulanate]    Review of Systems   Review of Systems  Cardiovascular:  Positive for chest pain.    Physical Exam Updated Vital Signs BP (!) 159/88   Pulse 67   Temp 99.1 F (37.3 C)   Resp 16   Ht 5\' 3"  (1.6 m)   Wt 115.7 kg   SpO2 100%   BMI 45.17 kg/m  Physical Exam  ED Results / Procedures /  Treatments   Labs (all labs ordered are listed, but only abnormal results are displayed) Labs Reviewed  BASIC METABOLIC PANEL - Abnormal; Notable for the following components:      Result Value   Potassium 3.4 (*)    All other components within normal limits  CBC - Abnormal; Notable for the following components:   RBC 5.42 (*)    MCV 77.3 (*)    MCH 24.7 (*)    All other components within normal limits  TROPONIN I (HIGH SENSITIVITY)  TROPONIN I (HIGH SENSITIVITY)    EKG EKG Interpretation Date/Time:  Sunday February 24 2023 15:59:30 EST Ventricular Rate:  94 PR Interval:  148 QRS Duration:  97 QT Interval:  344 QTC Calculation: 431 R Axis:   102  Text Interpretation: Sinus rhythm Borderline right axis deviation Low voltage, precordial leads Borderline T abnormalities, anterior leads Confirmed by Bethann Berkshire [phone]) on 02/24/2023 8:37:33 PM  Radiology DG Chest 2 View  Result Date: 02/24/2023 CLINICAL DATA:  Chest pain. EXAM: CHEST - 2 VIEW COMPARISON:  Chest radiograph dated January 25, 2017. FINDINGS: The heart size and mediastinal contours are within normal limits. Both lungs are clear. The visualized skeletal structures are unremarkable. IMPRESSION: No active cardiopulmonary disease. Electronically Signed   By: Hart Robinsons M.D.   On: 02/24/2023 17:09  Procedures Procedures  {Document cardiac monitor, telemetry assessment procedure when appropriate:1}  Medications Ordered in ED Medications - No data to display  ED Course/ Medical Decision Making/ A&P  EKG and lab work unremarkable.  He will be given albuterol inhaler and will follow-up with her PCP {   Click here for ABCD2, HEART and other calculatorsREFRESH Note before signing :1}                              Medical Decision Making Amount and/or Complexity of Data Reviewed Labs: ordered. Radiology: ordered.  Risk Prescription drug management.   Atypical chest pain and some shortness of breath with  anxiety  {Document critical care time when appropriate:1} {Document review of labs and clinical decision tools ie heart score, Chads2Vasc2 etc:1}  {Document your independent review of radiology images, and any outside records:1} {Document your discussion with family members, caretakers, and with consultants:1} {Document social determinants of health affecting pt's care:1} {Document your decision making why or why not admission, treatments were needed:1} Final Clinical Impression(s) / ED Diagnoses Final diagnoses:  Atypical chest pain    Rx / DC Orders ED Discharge Orders          Ordered    albuterol (VENTOLIN HFA) 108 (90 Base) MCG/ACT inhaler  Every 6 hours PRN        02/24/23 2039

## 2023-02-24 NOTE — ED Triage Notes (Signed)
Patient arrives ambulatory by POV c/o chest discomfort onset of yesterday. States pain wraps underneath her breast. Describes it as tender.

## 2023-02-26 DIAGNOSIS — F419 Anxiety disorder, unspecified: Secondary | ICD-10-CM | POA: Diagnosis not present

## 2023-03-01 ENCOUNTER — Other Ambulatory Visit (HOSPITAL_COMMUNITY): Payer: Self-pay

## 2023-03-05 DIAGNOSIS — F419 Anxiety disorder, unspecified: Secondary | ICD-10-CM | POA: Diagnosis not present

## 2023-03-07 DIAGNOSIS — Z113 Encounter for screening for infections with a predominantly sexual mode of transmission: Secondary | ICD-10-CM | POA: Diagnosis not present

## 2023-03-11 DIAGNOSIS — F419 Anxiety disorder, unspecified: Secondary | ICD-10-CM | POA: Diagnosis not present

## 2023-03-19 DIAGNOSIS — F419 Anxiety disorder, unspecified: Secondary | ICD-10-CM | POA: Diagnosis not present

## 2023-03-26 DIAGNOSIS — Z23 Encounter for immunization: Secondary | ICD-10-CM | POA: Diagnosis not present

## 2023-03-28 ENCOUNTER — Ambulatory Visit: Payer: 59 | Admitting: Hematology and Oncology

## 2023-03-28 ENCOUNTER — Other Ambulatory Visit: Payer: 59

## 2023-04-02 DIAGNOSIS — F419 Anxiety disorder, unspecified: Secondary | ICD-10-CM | POA: Diagnosis not present

## 2023-04-15 ENCOUNTER — Other Ambulatory Visit (HOSPITAL_COMMUNITY): Payer: Self-pay

## 2023-04-15 DIAGNOSIS — F419 Anxiety disorder, unspecified: Secondary | ICD-10-CM | POA: Diagnosis not present

## 2023-04-16 ENCOUNTER — Other Ambulatory Visit (HOSPITAL_COMMUNITY): Payer: Self-pay

## 2023-04-16 MED ORDER — ESCITALOPRAM OXALATE 10 MG PO TABS
10.0000 mg | ORAL_TABLET | Freq: Every day | ORAL | 1 refills | Status: DC
  Filled 2023-04-16: qty 90, 90d supply, fill #0
  Filled 2023-07-06: qty 90, 90d supply, fill #1

## 2023-04-19 ENCOUNTER — Other Ambulatory Visit (HOSPITAL_COMMUNITY): Payer: Self-pay

## 2023-05-07 DIAGNOSIS — F419 Anxiety disorder, unspecified: Secondary | ICD-10-CM | POA: Diagnosis not present

## 2023-05-13 DIAGNOSIS — F419 Anxiety disorder, unspecified: Secondary | ICD-10-CM | POA: Diagnosis not present

## 2023-05-21 DIAGNOSIS — F419 Anxiety disorder, unspecified: Secondary | ICD-10-CM | POA: Diagnosis not present

## 2023-05-22 ENCOUNTER — Other Ambulatory Visit (HOSPITAL_COMMUNITY): Payer: Self-pay

## 2023-05-22 MED ORDER — CIPROFLOXACIN HCL 500 MG PO TABS
500.0000 mg | ORAL_TABLET | Freq: Two times a day (BID) | ORAL | 0 refills | Status: AC
  Filled 2023-05-22: qty 14, 7d supply, fill #0

## 2023-05-28 DIAGNOSIS — F419 Anxiety disorder, unspecified: Secondary | ICD-10-CM | POA: Diagnosis not present

## 2023-05-31 ENCOUNTER — Other Ambulatory Visit (HOSPITAL_COMMUNITY): Payer: Self-pay

## 2023-06-01 ENCOUNTER — Other Ambulatory Visit (HOSPITAL_COMMUNITY): Payer: Self-pay

## 2023-06-05 DIAGNOSIS — F419 Anxiety disorder, unspecified: Secondary | ICD-10-CM | POA: Diagnosis not present

## 2023-06-13 DIAGNOSIS — F419 Anxiety disorder, unspecified: Secondary | ICD-10-CM | POA: Diagnosis not present

## 2023-06-20 ENCOUNTER — Ambulatory Visit (HOSPITAL_COMMUNITY)
Admission: RE | Admit: 2023-06-20 | Discharge: 2023-06-20 | Disposition: A | Discharge status: Home / Self Care | Payer: 59 | Source: Ambulatory Visit | Attending: Physician Assistant | Admitting: Physician Assistant

## 2023-06-20 DIAGNOSIS — R590 Localized enlarged lymph nodes: Secondary | ICD-10-CM | POA: Diagnosis not present

## 2023-06-20 MED ORDER — IOHEXOL 300 MG/ML  SOLN
100.0000 mL | Freq: Once | INTRAMUSCULAR | Status: AC | PRN
  Administered 2023-06-20: 100 mL via INTRAVENOUS

## 2023-06-24 DIAGNOSIS — F419 Anxiety disorder, unspecified: Secondary | ICD-10-CM | POA: Diagnosis not present

## 2023-06-27 ENCOUNTER — Inpatient Hospital Stay (HOSPITAL_BASED_OUTPATIENT_CLINIC_OR_DEPARTMENT_OTHER): Payer: 59 | Admitting: Hematology and Oncology

## 2023-06-27 ENCOUNTER — Inpatient Hospital Stay: Payer: 59 | Attending: Hematology and Oncology

## 2023-06-27 ENCOUNTER — Other Ambulatory Visit: Payer: Self-pay | Admitting: Hematology and Oncology

## 2023-06-27 VITALS — BP 135/90 | HR 87 | Temp 97.7°F | Resp 16 | Wt 257.6 lb

## 2023-06-27 DIAGNOSIS — Z8379 Family history of other diseases of the digestive system: Secondary | ICD-10-CM | POA: Diagnosis not present

## 2023-06-27 DIAGNOSIS — Z823 Family history of stroke: Secondary | ICD-10-CM | POA: Insufficient documentation

## 2023-06-27 DIAGNOSIS — R110 Nausea: Secondary | ICD-10-CM | POA: Diagnosis not present

## 2023-06-27 DIAGNOSIS — R590 Localized enlarged lymph nodes: Secondary | ICD-10-CM | POA: Diagnosis not present

## 2023-06-27 DIAGNOSIS — Z881 Allergy status to other antibiotic agents status: Secondary | ICD-10-CM | POA: Diagnosis not present

## 2023-06-27 DIAGNOSIS — Z880 Allergy status to penicillin: Secondary | ICD-10-CM | POA: Diagnosis not present

## 2023-06-27 DIAGNOSIS — Z833 Family history of diabetes mellitus: Secondary | ICD-10-CM | POA: Insufficient documentation

## 2023-06-27 DIAGNOSIS — R232 Flushing: Secondary | ICD-10-CM | POA: Insufficient documentation

## 2023-06-27 DIAGNOSIS — Z79899 Other long term (current) drug therapy: Secondary | ICD-10-CM | POA: Diagnosis not present

## 2023-06-27 DIAGNOSIS — Z8249 Family history of ischemic heart disease and other diseases of the circulatory system: Secondary | ICD-10-CM | POA: Insufficient documentation

## 2023-06-27 LAB — CMP (CANCER CENTER ONLY)
ALT: 19 U/L (ref 0–44)
AST: 19 U/L (ref 15–41)
Albumin: 3.7 g/dL (ref 3.5–5.0)
Alkaline Phosphatase: 65 U/L (ref 38–126)
Anion gap: 7 (ref 5–15)
BUN: 14 mg/dL (ref 6–20)
CO2: 23 mmol/L (ref 22–32)
Calcium: 8.8 mg/dL — ABNORMAL LOW (ref 8.9–10.3)
Chloride: 106 mmol/L (ref 98–111)
Creatinine: 0.68 mg/dL (ref 0.44–1.00)
GFR, Estimated: >60 mL/min (ref >60)
Glucose, Bld: 96 mg/dL (ref 70–99)
Potassium: 3.9 mmol/L (ref 3.5–5.1)
Sodium: 136 mmol/L (ref 135–145)
Total Bilirubin: 0.6 mg/dL (ref 0.0–1.2)
Total Protein: 7.9 g/dL (ref 6.5–8.1)

## 2023-06-27 LAB — CBC WITH DIFFERENTIAL (CANCER CENTER ONLY)
Abs Immature Granulocytes: 0 10*3/uL (ref 0.00–0.07)
Basophils Absolute: 0 10*3/uL (ref 0.0–0.1)
Basophils Relative: 1 %
Eosinophils Absolute: 0.1 10*3/uL (ref 0.0–0.5)
Eosinophils Relative: 2 %
HCT: 41.1 % (ref 36.0–46.0)
Hemoglobin: 13.4 g/dL (ref 12.0–15.0)
Immature Granulocytes: 0 %
Lymphocytes Relative: 40 %
Lymphs Abs: 1.4 10*3/uL (ref 0.7–4.0)
MCH: 24.4 pg — ABNORMAL LOW (ref 26.0–34.0)
MCHC: 32.6 g/dL (ref 30.0–36.0)
MCV: 74.7 fL — ABNORMAL LOW (ref 80.0–100.0)
Monocytes Absolute: 0.4 10*3/uL (ref 0.1–1.0)
Monocytes Relative: 12 %
Neutro Abs: 1.7 10*3/uL (ref 1.7–7.7)
Neutrophils Relative %: 45 %
Platelet Count: 248 10*3/uL (ref 150–400)
RBC: 5.5 MIL/uL — ABNORMAL HIGH (ref 3.87–5.11)
RDW: 13.3 % (ref 11.5–15.5)
WBC Count: 3.6 10*3/uL — ABNORMAL LOW (ref 4.0–10.5)
nRBC: 0 % (ref 0.0–0.2)

## 2023-06-27 LAB — HIV ANTIBODY (ROUTINE TESTING W REFLEX): HIV Screen 4th Generation wRfx: NONREACTIVE

## 2023-06-27 LAB — C-REACTIVE PROTEIN: CRP: 1 mg/dL — ABNORMAL HIGH (ref <1.0)

## 2023-06-27 LAB — LACTATE DEHYDROGENASE: LDH: 108 U/L (ref 98–192)

## 2023-06-27 LAB — HEPATITIS B SURFACE ANTIBODY,QUALITATIVE: Hep B S Ab: NONREACTIVE

## 2023-06-27 LAB — SEDIMENTATION RATE: Sed Rate: 27 mm/h — ABNORMAL HIGH (ref 0–22)

## 2023-06-27 LAB — HEPATITIS B SURFACE ANTIGEN: Hepatitis B Surface Ag: NONREACTIVE

## 2023-06-27 LAB — HEPATITIS C ANTIBODY: HCV Ab: NONREACTIVE

## 2023-06-27 NOTE — Progress Notes (Signed)
 [facility] [phone]   [phone]  PROGRESS NOTE  Patient Care Team: Lequita Asal, MD as PCP - General (Family Medicine)  Hematological/Oncological History # Mesenteric lymphadenopathy  01/01/2023: establish care with [facility]   Interval History:  Michelle Simon 28 y.o. female with medical history significant for mesenteric lymphadenopathy who presents for a follow up visit. The patient's last visit was on 01/01/2023 at which time she established care. In the interim since the last visit she underwent a PET dotatate scan and CT of the abdomen, both which revealed stable disease and low suspicion this represents a malignant process.  On exam today Michelle Simon reports that everything has been okay in interim since her last visit.  She has had no major issues and believes that her GI upset was due to GERD.  She reports that she has not noticed any enlarged lymph nodes on her neck, underarms, or groin.  She reports does have some occasional nausea with her menstrual cycle but no vomiting or diarrhea.  She reports that she also made some changes to her diet, avoiding fatty, heavy, and greasy foods.  She reports he is trying to eat more fresh veggies and eating more seafood and salmon.  She reports that she does have an occasional hot flash but no infectious symptoms such as runny nose, sore throat, cough.  She denies any fevers, chills, sweats.  Reports that she does have a little bit of allergies on occasion but nothing major.  Full 10 point ROS is otherwise negative.  Today the bulk of our discussion focused on the results of her CT scan and steps moving forward.  We discussed observation and how she could follow-up with her PCP or GI and if she would have any new or worsening symptoms we would be happy to see her back.  She voiced understanding of this and notes that she will return to [place] if she develops any new or concerning symptoms.  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  Tobacco Use   Smoking status: Never   Smokeless tobacco: Never  Vaping Use   Vaping status: Never Used  Substance and Sexual Activity   Alcohol use: Not Currently    Comment: rarely   Drug use: Not Currently    Types: Marijuana   Sexual activity: Yes    Birth control/protection: Implant  Other Topics Concern   Not on file  Social History Narrative   Not on file   Social Drivers of Health   Financial Resource Strain: Not on file  Food Insecurity: No Food Insecurity (01/01/2023)   Hunger Vital Sign    Worried About Running Out of Food in the Last Year: Never true    Ran Out of Food in the Last Year: Never true  Transportation Needs: No Transportation Needs (  01/01/2023)   PRAPARE - Administrator, Civil Service (Medical): No    Lack of Transportation (Non-Medical): No  Physical Activity: Not on file  Stress: Not on file  Social Connections: Not on file  Intimate Partner Violence: Not At Risk (01/01/2023)   Humiliation, Afraid, Rape, and Kick questionnaire    Fear of Current or Ex-Partner: No    Emotionally Abused: No    Physically Abused: No    Sexually Abused: No    FAMILY HISTORY: Family History  Problem Relation Age of Onset   Hypertension Mother    Diabetes Mother    CVA Mother    Heart disease Father    Diabetes Father    Cirrhosis Father    Diabetes Maternal  Grandmother    Stomach cancer Neg Hx    Colon cancer Neg Hx    Rectal cancer Neg Hx    Pancreatic cancer Neg Hx    Esophageal cancer Neg Hx     ALLERGIES:  is allergic to augmentin [amoxicillin-pot clavulanate].  MEDICATIONS:  Current Outpatient Medications  Medication Sig Dispense Refill   albuterol (VENTOLIN HFA) 108 (90 Base) MCG/ACT inhaler Inhale 1-2 puffs into the lungs every 6 (six) hours as needed for wheezing or shortness of breath. 8 g 0   amitriptyline (ELAVIL) 50 MG tablet Take 1 tablet (50 mg total) by mouth at bedtime. 90 tablet 1   ciprofloxacin (CIPRO) 500 MG tablet Take 1 tablet (500 mg total) by mouth 2 (two) times daily. 14 tablet 0   ergocalciferol (VITAMIN D2) 1.25 MG (50000 UT) capsule Take 1 capsule (50,000 Units total) by mouth once a week. 12 capsule 0   escitalopram (LEXAPRO) 10 MG tablet Take 1 tablet (10 mg total) by mouth daily for anxiety. 90 tablet 1   etonogestrel (NEXPLANON) 68 MG IMPL implant 1 each by Subdermal route once.     LORazepam (ATIVAN) 1 MG tablet Take 1 mg by mouth daily as needed.     omeprazole (PRILOSEC) 20 MG capsule Take 1 capsule (20 mg total) by mouth daily. 90 capsule 3   polyethylene glycol (MIRALAX) 17 g packet Take 17 g by mouth daily. (Patient taking differently: Take 17 g by mouth daily as needed for mild constipation or moderate constipation.)     No current facility-administered medications for this visit.    REVIEW OF SYSTEMS:   Constitutional: ( - ) fevers, ( - )  chills , ( - ) night sweats Eyes: ( - ) blurriness of vision, ( - ) double vision, ( - ) watery eyes Ears, nose, mouth, throat, and face: ( - ) mucositis, ( - ) sore throat Respiratory: ( - ) cough, ( - ) dyspnea, ( - ) wheezes Cardiovascular: ( - ) palpitation, ( - ) chest discomfort, ( - ) lower extremity swelling Gastrointestinal:  ( - ) nausea, ( - ) heartburn, ( - ) change in bowel habits Skin: ( - ) abnormal skin rashes Lymphatics: ( - ) new  lymphadenopathy, ( - ) easy bruising Neurological: ( - ) numbness, ( - ) tingling, ( - ) new weaknesses Behavioral/Psych: ( - ) mood change, ( - ) new changes  All other systems were reviewed with the patient and are negative.  PHYSICAL EXAMINATION:  Vitals:   06/27/23 0857  BP: (!) 135/90  Pulse: 87  Resp: 16  Temp: 97.7 F (36.5 C)  SpO2: 100%   Filed Weights   06/27/23 0857  Weight: 257 lb  9.6 oz (116.8 kg)    GENERAL: Well-appearing young African-American female, alert, no distress and comfortable SKIN: skin color, texture, turgor are normal, no rashes or significant lesions EYES: conjunctiva are pink and non-injected, sclera clear LUNGS: clear to auscultation and percussion with normal breathing effort HEART: regular rate & rhythm and no murmurs and no lower extremity edema Musculoskeletal: no cyanosis of digits and no clubbing  PSYCH: alert & oriented x 3, fluent speech NEURO: no focal motor/sensory deficits  LABORATORY DATA:  I have reviewed the data as listed    Latest Ref Rng & Units 06/27/2023    8:22 AM 02/24/2023    6:44 PM 01/01/2023    1:56 PM  CBC  WBC 4.0 - 10.5 K/uL 3.6  4.9  4.2   Hemoglobin 12.0 - 15.0 g/dL 16.1  09.6  04.5   Hematocrit 36.0 - 46.0 % 41.1  41.9  39.3   Platelets 150 - 400 K/uL 248  264  262        Latest Ref Rng & Units 06/27/2023    8:22 AM 02/24/2023    4:27 PM 01/01/2023    1:56 PM  CMP  Glucose 70 - 99 mg/dL 96  98  409   BUN 6 - 20 mg/dL 14  8  11    Creatinine 0.44 - 1.00 mg/dL 8.11  9.14  7.82   Sodium 135 - 145 mmol/L 136  136  138   Potassium 3.5 - 5.1 mmol/L 3.9  3.4  3.6   Chloride 98 - 111 mmol/L 106  103  106   CO2 22 - 32 mmol/L 23  24  26    Calcium 8.9 - 10.3 mg/dL 8.8  8.9  9.0   Total Protein 6.5 - 8.1 g/dL 7.9   7.7   Total Bilirubin 0.0 - 1.2 mg/dL 0.6   0.3   Alkaline Phos 38 - 126 U/L 65   65   AST 15 - 41 U/L 19   14   ALT 0 - 44 U/L 19   13    RADIOGRAPHIC STUDIES: CT ABDOMEN PELVIS W  CONTRAST Result Date: 06/23/2023 CLINICAL DATA:  Monitor mesenteric lymphadenopathy. * Tracking Code: BO * EXAM: CT ABDOMEN AND PELVIS WITH CONTRAST TECHNIQUE: Multidetector CT imaging of the abdomen and pelvis was performed using the standard protocol following bolus administration of intravenous contrast. RADIATION DOSE REDUCTION: This exam was performed according to the departmental dose-optimization program which includes automated exposure control, adjustment of the mA and/or kV according to patient size and/or use of iterative reconstruction technique. CONTRAST:  OMNIPAQUE IOHEXOL 300 MG/ML  SOLN COMPARISON:  Multiple priors including PET-CT January 11, 2023 December 31, 2022 and CT December 20, 2022 FINDINGS: Lower chest: Motion degraded examination of the lung bases. No acute abnormality. Hepatobiliary: No suspicious hepatic lesion. Gallbladder is unremarkable. No biliary ductal dilation. Pancreas: No pancreatic ductal dilation or evidence of acute inflammation. Spleen: No splenomegaly. Adrenals/Urinary Tract: Bilateral adrenal glands appear normal. No hydronephrosis. Kidneys demonstrate symmetric enhancement. Urinary bladder is unremarkable for degree of distension. Stomach/Bowel: Stomach is within normal limits. No evidence of bowel wall thickening, distention, or inflammatory changes. Vascular/Lymphatic: Normal caliber abdominal aorta. The portal, splenic and superior mesenteric veins are patent. Innumerable mildly enlarged/prominent retroperitoneal and mesenteric lymph nodes are similar prior examination measuring up to 1 cm in short axis on image 49/2, unchanged. Reproductive: Uterus and bilateral adnexa are unremarkable. Other: No significant abdominopelvic free fluid. Musculoskeletal: No acute osseous abnormality. IMPRESSION: Innumerable mildly  enlarged/prominent retroperitoneal and mesenteric lymph nodes are similar prior examination measuring up to 1 cm. Electronically Signed   By: Maudry Mayhew M.D.   On: 06/23/2023 14:09    ASSESSMENT & PLAN Michelle Simon 28 y.o. female with medical history significant for mesenteric lymphadenopathy who presents for a follow up visit.   # Mesenteric Lymphadenopathy  -- Patient has stable subcentimeter lymphadenopathy in the mesentery, at this time appear benign based on serial imaging and PET dotatate scan. -- No concerning abnormalities in prior labs other than increased ESR. -- Will repeat inflammatory markers with ESR, CRP and order ANA. -- Will also order viral serologies with hepatitis B, C, HIV -- As long as patient feels well no need for return to our clinic.  If you develop worsening lymphadenopathy or symptoms we are happy to see her back. -- Return to clinic as needed.  No orders of the defined types were placed in this encounter.   All questions were answered. The patient knows to call the clinic with any problems, questions or concerns.  A total of more than 30 minutes were spent on this encounter with face-to-face time and non-face-to-face time, including preparing to see the patient, ordering tests and/or medications, counseling the patient and coordination of care as outlined above.   Ulysees Barns, MD Department of Hematology/Oncology [facility] Phone: [phone] Pager: [phone] Email: [email or web address]  06/27/2023 4:33 PM

## 2023-06-28 DIAGNOSIS — Z23 Encounter for immunization: Secondary | ICD-10-CM | POA: Diagnosis not present

## 2023-06-28 LAB — HEPATITIS B CORE ANTIBODY, TOTAL: HEP B CORE AB: NEGATIVE

## 2023-06-29 ENCOUNTER — Encounter: Payer: Self-pay | Admitting: Hematology and Oncology

## 2023-07-01 ENCOUNTER — Other Ambulatory Visit (HOSPITAL_COMMUNITY): Payer: Self-pay

## 2023-07-01 DIAGNOSIS — F419 Anxiety disorder, unspecified: Secondary | ICD-10-CM | POA: Diagnosis not present

## 2023-07-01 MED ORDER — SCOPOLAMINE 1 MG/3DAYS TD PT72
MEDICATED_PATCH | TRANSDERMAL | 0 refills | Status: AC
  Filled 2023-07-01: qty 3, 9d supply, fill #0

## 2023-07-05 LAB — ANTINUCLEAR ANTIBODIES, IFA: ANA Ab, IFA: NEGATIVE

## 2023-07-10 ENCOUNTER — Other Ambulatory Visit (HOSPITAL_COMMUNITY): Payer: Self-pay

## 2023-07-10 MED ORDER — LORAZEPAM 1 MG PO TABS
1.0000 mg | ORAL_TABLET | Freq: Three times a day (TID) | ORAL | 1 refills | Status: AC
  Filled 2023-07-10: qty 30, 10d supply, fill #0

## 2023-07-16 ENCOUNTER — Other Ambulatory Visit (HOSPITAL_COMMUNITY): Payer: Self-pay

## 2023-07-30 DIAGNOSIS — F419 Anxiety disorder, unspecified: Secondary | ICD-10-CM | POA: Diagnosis not present

## 2023-08-13 DIAGNOSIS — F419 Anxiety disorder, unspecified: Secondary | ICD-10-CM | POA: Diagnosis not present

## 2023-08-13 DIAGNOSIS — F4320 Adjustment disorder, unspecified: Secondary | ICD-10-CM | POA: Diagnosis not present

## 2023-08-27 DIAGNOSIS — F419 Anxiety disorder, unspecified: Secondary | ICD-10-CM | POA: Diagnosis not present

## 2023-09-10 DIAGNOSIS — F419 Anxiety disorder, unspecified: Secondary | ICD-10-CM | POA: Diagnosis not present

## 2023-09-16 DIAGNOSIS — F419 Anxiety disorder, unspecified: Secondary | ICD-10-CM | POA: Diagnosis not present

## 2023-09-24 DIAGNOSIS — F419 Anxiety disorder, unspecified: Secondary | ICD-10-CM | POA: Diagnosis not present

## 2023-10-07 DIAGNOSIS — F419 Anxiety disorder, unspecified: Secondary | ICD-10-CM | POA: Diagnosis not present

## 2023-10-09 ENCOUNTER — Other Ambulatory Visit (HOSPITAL_COMMUNITY): Payer: Self-pay

## 2023-10-09 MED ORDER — ESCITALOPRAM OXALATE 10 MG PO TABS
10.0000 mg | ORAL_TABLET | Freq: Every day | ORAL | 1 refills | Status: AC
  Filled 2023-10-09: qty 90, 90d supply, fill #0
  Filled 2024-01-02 – 2024-03-31 (×2): qty 90, 90d supply, fill #1

## 2023-10-21 DIAGNOSIS — E559 Vitamin D deficiency, unspecified: Secondary | ICD-10-CM | POA: Diagnosis not present

## 2023-10-21 DIAGNOSIS — Z131 Encounter for screening for diabetes mellitus: Secondary | ICD-10-CM | POA: Diagnosis not present

## 2023-10-21 DIAGNOSIS — F419 Anxiety disorder, unspecified: Secondary | ICD-10-CM | POA: Diagnosis not present

## 2023-10-21 DIAGNOSIS — Z0000 Encounter for general adult medical examination without abnormal findings: Secondary | ICD-10-CM | POA: Diagnosis not present

## 2023-10-21 DIAGNOSIS — R079 Chest pain, unspecified: Secondary | ICD-10-CM | POA: Diagnosis not present

## 2023-10-23 ENCOUNTER — Other Ambulatory Visit (HOSPITAL_COMMUNITY): Payer: Self-pay

## 2023-10-31 ENCOUNTER — Other Ambulatory Visit (HOSPITAL_COMMUNITY): Payer: Self-pay

## 2023-10-31 MED ORDER — ERGOCALCIFEROL 1.25 MG (50000 UT) PO CAPS
50000.0000 [IU] | ORAL_CAPSULE | ORAL | 0 refills | Status: AC
Start: 2023-10-31 — End: ?
  Filled 2023-10-31: qty 12, 84d supply, fill #0

## 2023-11-08 ENCOUNTER — Other Ambulatory Visit (HOSPITAL_COMMUNITY): Payer: Self-pay

## 2023-11-11 DIAGNOSIS — F419 Anxiety disorder, unspecified: Secondary | ICD-10-CM | POA: Diagnosis not present

## 2023-11-15 ENCOUNTER — Other Ambulatory Visit (HOSPITAL_COMMUNITY): Payer: Self-pay

## 2023-11-15 MED ORDER — ESCITALOPRAM OXALATE 10 MG PO TABS
10.0000 mg | ORAL_TABLET | Freq: Every day | ORAL | 1 refills | Status: AC
  Filled 2023-11-15 – 2024-01-13 (×2): qty 90, 90d supply, fill #0

## 2023-11-18 ENCOUNTER — Other Ambulatory Visit (HOSPITAL_COMMUNITY): Payer: Self-pay

## 2023-11-21 ENCOUNTER — Other Ambulatory Visit: Payer: Self-pay | Admitting: Gastroenterology

## 2023-11-22 ENCOUNTER — Other Ambulatory Visit (HOSPITAL_COMMUNITY): Payer: Self-pay

## 2023-11-22 MED ORDER — OMEPRAZOLE 20 MG PO CPDR
20.0000 mg | DELAYED_RELEASE_CAPSULE | Freq: Every day | ORAL | 3 refills | Status: AC
  Filled 2023-11-22: qty 90, 90d supply, fill #0

## 2023-11-25 DIAGNOSIS — F419 Anxiety disorder, unspecified: Secondary | ICD-10-CM | POA: Diagnosis not present

## 2023-12-09 DIAGNOSIS — F419 Anxiety disorder, unspecified: Secondary | ICD-10-CM | POA: Diagnosis not present

## 2023-12-12 DIAGNOSIS — Z8349 Family history of other endocrine, nutritional and metabolic diseases: Secondary | ICD-10-CM | POA: Diagnosis not present

## 2023-12-12 DIAGNOSIS — Z6841 Body Mass Index (BMI) 40.0 and over, adult: Secondary | ICD-10-CM | POA: Diagnosis not present

## 2023-12-12 DIAGNOSIS — Z713 Dietary counseling and surveillance: Secondary | ICD-10-CM | POA: Diagnosis not present

## 2023-12-12 DIAGNOSIS — Z8249 Family history of ischemic heart disease and other diseases of the circulatory system: Secondary | ICD-10-CM | POA: Diagnosis not present

## 2024-01-02 DIAGNOSIS — F4320 Adjustment disorder, unspecified: Secondary | ICD-10-CM | POA: Diagnosis not present

## 2024-01-09 DIAGNOSIS — Z01419 Encounter for gynecological examination (general) (routine) without abnormal findings: Secondary | ICD-10-CM | POA: Diagnosis not present

## 2024-01-09 DIAGNOSIS — Z3046 Encounter for surveillance of implantable subdermal contraceptive: Secondary | ICD-10-CM | POA: Diagnosis not present

## 2024-01-09 DIAGNOSIS — Z124 Encounter for screening for malignant neoplasm of cervix: Secondary | ICD-10-CM | POA: Diagnosis not present

## 2024-01-09 DIAGNOSIS — R87810 Cervical high risk human papillomavirus (HPV) DNA test positive: Secondary | ICD-10-CM | POA: Diagnosis not present

## 2024-01-09 DIAGNOSIS — Z1389 Encounter for screening for other disorder: Secondary | ICD-10-CM | POA: Diagnosis not present

## 2024-01-09 DIAGNOSIS — R87610 Atypical squamous cells of undetermined significance on cytologic smear of cervix (ASC-US): Secondary | ICD-10-CM | POA: Diagnosis not present

## 2024-01-09 DIAGNOSIS — Z130 Encounter for screening for diseases of the blood and blood-forming organs and certain disorders involving the immune mechanism: Secondary | ICD-10-CM | POA: Diagnosis not present

## 2024-01-12 ENCOUNTER — Other Ambulatory Visit (HOSPITAL_COMMUNITY): Payer: Self-pay

## 2024-01-13 ENCOUNTER — Other Ambulatory Visit (HOSPITAL_COMMUNITY): Payer: Self-pay

## 2024-01-27 DIAGNOSIS — F419 Anxiety disorder, unspecified: Secondary | ICD-10-CM | POA: Diagnosis not present

## 2024-02-02 ENCOUNTER — Ambulatory Visit: Payer: Self-pay | Admitting: Nurse Practitioner

## 2024-02-02 ENCOUNTER — Ambulatory Visit
Admission: EM | Admit: 2024-02-02 | Discharge: 2024-02-02 | Disposition: A | Discharge status: Home / Self Care | Attending: Family Medicine | Admitting: Family Medicine

## 2024-02-02 ENCOUNTER — Ambulatory Visit

## 2024-02-02 DIAGNOSIS — S93401A Sprain of unspecified ligament of right ankle, initial encounter: Secondary | ICD-10-CM

## 2024-02-02 DIAGNOSIS — M25571 Pain in right ankle and joints of right foot: Secondary | ICD-10-CM | POA: Diagnosis not present

## 2024-02-02 DIAGNOSIS — M7989 Other specified soft tissue disorders: Secondary | ICD-10-CM | POA: Diagnosis not present

## 2024-02-02 NOTE — ED Provider Notes (Signed)
 [facility]    CSN: [id number] Arrival date & time: 02/02/24  0851      History   Chief Complaint Chief Complaint  Patient presents with   Fall   Ankle Injury    HPI Michelle Simon is a 28 y.o. female presents for ankle pain.  Patient reports yesterday she twisted her right ankle while walking her dog.  States that she is able to bear weight with pain and has swelling to the lateral ankle.  No numbness, tingling, bruising.  No history of injuries or surgeries to the ankle in the past.  She has been doing an Ace wrap using ice and elevation.  No other concerns or injuries at this time   Fall  Ankle Injury    Past Medical History:  Diagnosis Date   Anxiety    Chronic constipation    GERD (gastroesophageal reflux disease)    Morbid obesity (HCC) 11/02/2022   46.13   Panic attack     There are no active problems to display for this patient.   Past Surgical History:  Procedure Laterality Date   NO PAST SURGERIES      OB History   No obstetric history on file.      Home Medications    Prior to Admission medications   Medication Sig Start Date End Date Taking? Authorizing Provider  albuterol  (VENTOLIN  HFA) 108 (90 Base) MCG/ACT inhaler Inhale 1-2 puffs into the lungs every 6 (six) hours as needed for wheezing or shortness of breath. 02/24/23   Suzette Pac, MD  amitriptyline  (ELAVIL ) 50 MG tablet Take 1 tablet (50 mg total) by mouth at bedtime. 07/03/22     ciprofloxacin  (CIPRO ) 500 MG tablet Take 1 tablet (500 mg total) by mouth 2 (two) times daily. 05/22/23     ergocalciferol  (VITAMIN D2) 1.25 MG (50000 UT) capsule Take 1 capsule (50,000 Units total) by mouth once a week. 07/17/22     ergocalciferol  (VITAMIN D2) 1.25 MG (50000 UT) capsule Take 1 capsule (50,000 Units total) by mouth once a week. 10/31/23     escitalopram  (LEXAPRO ) 10 MG tablet Take 1 tablet (10 mg total) by mouth daily for anxiety. 10/09/23     escitalopram  (LEXAPRO ) 10 MG tablet Take 1  tablet (10 mg total) by mouth daily. 11/15/23     etonogestrel  (NEXPLANON ) 68 MG IMPL implant 1 each by Subdermal route once.    [provider]  LORazepam  (ATIVAN ) 1 MG tablet Take 1 mg by mouth daily as needed.    [provider]  LORazepam  (ATIVAN ) 1 MG tablet Take 1 tablet (1 mg total) by mouth 3 (three) times daily. 07/10/23     omeprazole  (PRILOSEC) 20 MG capsule Take 1 capsule (20 mg total) by mouth daily. 11/22/23   Armbruster, Elspeth SQUIBB, MD  polyethylene glycol (MIRALAX ) 17 g packet Take 17 g by mouth daily. Patient taking differently: Take 17 g by mouth daily as needed for mild constipation or moderate constipation. 11/02/22   Armbruster, Elspeth SQUIBB, MD  scopolamine  (TRANSDERM-SCOP) 1 MG/3DAYS Apply 1 patch to the skin every 3 days as directed 07/01/23       Family History Family History  Problem Relation Age of Onset   Hypertension Mother    Diabetes Mother    CVA Mother    Heart disease Father    Diabetes Father    Cirrhosis Father    Diabetes Maternal Grandmother    Stomach cancer Neg Hx    Colon cancer Neg  Hx    Rectal cancer Neg Hx    Pancreatic cancer Neg Hx    Esophageal cancer Neg Hx     Social History Social History   Tobacco Use   Smoking status: Never   Smokeless tobacco: Never  Vaping Use   Vaping status: Never Used  Substance Use Topics   Alcohol use: Not Currently    Comment: rarely   Drug use: Not Currently    Types: Marijuana     Allergies   Augmentin [amoxicillin -pot clavulanate]   Review of Systems Review of Systems  Musculoskeletal:        Right ankle pain     Physical Exam Triage Vital Signs ED Triage Vitals  Encounter Vitals Group     BP 02/02/24 0903 126/83     Girls Systolic BP Percentile --      Girls Diastolic BP Percentile --      Boys Systolic BP Percentile --      Boys Diastolic BP Percentile --      Pulse Rate 02/02/24 0903 100     Resp 02/02/24 0903 16     Temp 02/02/24 0903 97.9 F (36.6 C)     Temp  Source 02/02/24 0903 Oral     SpO2 02/02/24 0903 99 %     Weight --      Height --      Head Circumference --      Peak Flow --      Pain Score 02/02/24 0902 6     Pain Loc --      Pain Education --      Exclude from Growth Chart --    No data found.  Updated Vital Signs BP 126/83 (BP Location: Right Arm)   Pulse 100   Temp 97.9 F (36.6 C) (Oral)   Resp 16   SpO2 99%   Visual Acuity Right Eye Distance:   Left Eye Distance:   Bilateral Distance:    Right Eye Near:   Left Eye Near:    Bilateral Near:     Physical Exam Vitals and nursing note reviewed.  Constitutional:      General: She is not in acute distress.    Appearance: Normal appearance. She is not ill-appearing.  HENT:     Head: Normocephalic and atraumatic.  Eyes:     Pupils: Pupils are equal, round, and reactive to light.  Cardiovascular:     Rate and Rhythm: Normal rate.  Pulmonary:     Effort: Pulmonary effort is normal.  Musculoskeletal:     Right ankle: Swelling present. No deformity, ecchymosis or lacerations. Tenderness present over the lateral malleolus. No medial malleolus or base of 5th metatarsal tenderness. Normal range of motion. Normal pulse.     Comments: DP +2.  Some mild pain with dorsi flexion.  Skin:    General: Skin is warm and dry.  Neurological:     General: No focal deficit present.     Mental Status: She is alert and oriented to person, place, and time.  Psychiatric:        Mood and Affect: Mood normal.        Behavior: Behavior normal.      UC Treatments / Results  Labs (all labs ordered are listed, but only abnormal results are displayed) Labs Reviewed - No data to display  EKG   Radiology DG Ankle Complete Right Result Date: 02/02/2024 CLINICAL DATA:  Fall with ankle pain and swelling. EXAM: RIGHT ANKLE -  COMPLETE 3+ VIEW COMPARISON:  No comparison studies available. FINDINGS: No evidence for an acute fracture. No subluxation or dislocation. No worrisome lytic or  sclerotic osseous abnormality. IMPRESSION: No acute bony findings. Electronically Signed   By: Camellia Candle M.D.   On: 02/02/2024 09:18    Procedures Procedures (including critical care time)  Medications Ordered in UC Medications - No data to display  Initial Impression / Assessment and Plan / UC Course  I have reviewed the triage vital signs and the nursing notes.  Pertinent labs & imaging results that were available during my care of the patient were reviewed by me and considered in my medical decision making (see chart for details).     Reviewed exam and symptoms with patient.  No red flags.  X-ray negative for fracture.  Discussed ankle sprain and RICE therapy.  Air cast fitted for patient advised to continue OTC analgesics as needed.  PCP follow-up if symptoms do not improve.  ER precautions reviewed Final Clinical Impressions(s) / UC Diagnoses   Final diagnoses:  Sprain of right ankle, unspecified ligament, initial encounter     Discharge Instructions      Use the Aircast to help support and stabilize the ankle joint.  Elevate and ice as needed.  May take Tylenol  or ibuprofen over-the-counter as needed.  Please follow-up with your PCP if your symptoms do not improve.  Please go to the ER if you develop any worsening symptoms.  Hope you feel better soon!    ED Prescriptions   None    PDMP not reviewed this encounter.   Loreda Myla SAUNDERS, NP 02/02/24 [phone]

## 2024-02-02 NOTE — Discharge Instructions (Signed)
 Use the Aircast to help support and stabilize the ankle joint.  Elevate and ice as needed.  May take Tylenol  or ibuprofen over-the-counter as needed.  Please follow-up with your PCP if your symptoms do not improve.  Please go to the ER if you develop any worsening symptoms.  Hope you feel better soon!

## 2024-02-02 NOTE — ED Triage Notes (Signed)
 Pt reports pain and swelling in the right ankle x 1 day after she twisted the right ankle when she was taking her dog for a walk. Ice and elevation gives some relief.

## 2024-02-04 DIAGNOSIS — F419 Anxiety disorder, unspecified: Secondary | ICD-10-CM | POA: Diagnosis not present

## 2024-02-13 DIAGNOSIS — F419 Anxiety disorder, unspecified: Secondary | ICD-10-CM | POA: Diagnosis not present

## 2024-02-18 ENCOUNTER — Other Ambulatory Visit (HOSPITAL_COMMUNITY): Payer: Self-pay

## 2024-02-18 MED ORDER — PREVIDENT 5000 BOOSTER PLUS 1.1 % DT PSTE
PASTE | DENTAL | 3 refills | Status: AC
  Filled 2024-02-18: qty 100, 30d supply, fill #0

## 2024-02-20 DIAGNOSIS — F419 Anxiety disorder, unspecified: Secondary | ICD-10-CM | POA: Diagnosis not present

## 2024-02-28 DIAGNOSIS — R87610 Atypical squamous cells of undetermined significance on cytologic smear of cervix (ASC-US): Secondary | ICD-10-CM | POA: Diagnosis not present

## 2024-03-05 DIAGNOSIS — F419 Anxiety disorder, unspecified: Secondary | ICD-10-CM | POA: Diagnosis not present

## 2024-03-06 ENCOUNTER — Encounter: Payer: Self-pay | Admitting: Oncology

## 2024-03-19 DIAGNOSIS — F4320 Adjustment disorder, unspecified: Secondary | ICD-10-CM | POA: Diagnosis not present

## 2024-03-19 DIAGNOSIS — F419 Anxiety disorder, unspecified: Secondary | ICD-10-CM | POA: Diagnosis not present

## 2024-03-26 DIAGNOSIS — F419 Anxiety disorder, unspecified: Secondary | ICD-10-CM | POA: Diagnosis not present

## 2024-03-31 ENCOUNTER — Other Ambulatory Visit (HOSPITAL_COMMUNITY): Payer: Self-pay

## 2024-03-31 ENCOUNTER — Other Ambulatory Visit: Payer: Self-pay

## 2024-03-31 MED ORDER — FLUCONAZOLE 150 MG PO TABS
150.0000 mg | ORAL_TABLET | ORAL | 0 refills | Status: AC
  Filled 2024-03-31: qty 2, 4d supply, fill #0

## 2024-04-01 ENCOUNTER — Other Ambulatory Visit (HOSPITAL_COMMUNITY): Payer: Self-pay

## 2024-04-01 MED ORDER — ERGOCALCIFEROL 1.25 MG (50000 UT) PO CAPS
50000.0000 [IU] | ORAL_CAPSULE | ORAL | 0 refills | Status: AC
  Filled 2024-04-01 (×2): qty 12, 84d supply, fill #0
